# Patient Record
Sex: Male | Born: 1965 | Race: White | Hispanic: No | Marital: Single | Smoking: Current every day smoker
Health system: Southern US, Community
[De-identification: ages and names within clinical notes are randomized; demographics above are authoritative.]

## PROBLEM LIST (undated history)

## (undated) ENCOUNTER — Emergency Department (HOSPITAL_COMMUNITY): Admission: EM | Payer: Self-pay | Source: Home / Self Care

## (undated) DIAGNOSIS — F1010 Alcohol abuse, uncomplicated: Secondary | ICD-10-CM

## (undated) DIAGNOSIS — F419 Anxiety disorder, unspecified: Secondary | ICD-10-CM

---

## 2005-08-30 ENCOUNTER — Emergency Department (HOSPITAL_COMMUNITY): Admission: EM | Admit: 2005-08-30 | Discharge: 2005-08-31 | Payer: Self-pay | Admitting: Emergency Medicine

## 2005-09-21 ENCOUNTER — Emergency Department (HOSPITAL_COMMUNITY): Admission: EM | Admit: 2005-09-21 | Discharge: 2005-09-21 | Payer: Self-pay | Admitting: Emergency Medicine

## 2005-10-04 ENCOUNTER — Emergency Department (HOSPITAL_COMMUNITY): Admission: EM | Admit: 2005-10-04 | Discharge: 2005-10-04 | Payer: Self-pay | Admitting: Emergency Medicine

## 2006-01-09 ENCOUNTER — Emergency Department (HOSPITAL_COMMUNITY): Admission: EM | Admit: 2006-01-09 | Discharge: 2006-01-09 | Payer: Self-pay | Admitting: Emergency Medicine

## 2010-04-30 ENCOUNTER — Emergency Department (HOSPITAL_COMMUNITY): Admission: EM | Admit: 2010-04-30 | Discharge: 2010-05-01 | Payer: Self-pay | Admitting: Emergency Medicine

## 2010-12-08 ENCOUNTER — Emergency Department (HOSPITAL_COMMUNITY)
Admission: EM | Admit: 2010-12-08 | Discharge: 2010-12-09 | Disposition: A | Discharge status: Home / Self Care | Payer: Self-pay | Attending: Emergency Medicine | Admitting: Emergency Medicine

## 2010-12-08 DIAGNOSIS — F329 Major depressive disorder, single episode, unspecified: Secondary | ICD-10-CM | POA: Insufficient documentation

## 2010-12-08 DIAGNOSIS — F1020 Alcohol dependence, uncomplicated: Secondary | ICD-10-CM | POA: Insufficient documentation

## 2010-12-08 DIAGNOSIS — F3289 Other specified depressive episodes: Secondary | ICD-10-CM | POA: Insufficient documentation

## 2010-12-08 DIAGNOSIS — F17200 Nicotine dependence, unspecified, uncomplicated: Secondary | ICD-10-CM | POA: Insufficient documentation

## 2010-12-08 DIAGNOSIS — E789 Disorder of lipoprotein metabolism, unspecified: Secondary | ICD-10-CM | POA: Insufficient documentation

## 2010-12-08 LAB — COMPREHENSIVE METABOLIC PANEL
ALT: 68 U/L — ABNORMAL HIGH (ref 0–53)
Albumin: 4 g/dL (ref 3.5–5.2)
Alkaline Phosphatase: 72 U/L (ref 39–117)
BUN: 7 mg/dL (ref 6–23)
Chloride: 105 mEq/L (ref 96–112)
Potassium: 3.2 mEq/L — ABNORMAL LOW (ref 3.5–5.1)
Sodium: 143 mEq/L (ref 135–145)
Total Bilirubin: 0.5 mg/dL (ref 0.3–1.2)
Total Protein: 7.5 g/dL (ref 6.0–8.3)

## 2010-12-08 LAB — URINALYSIS, ROUTINE W REFLEX MICROSCOPIC
Ketones, ur: 15 mg/dL — AB
Nitrite: NEGATIVE
Protein, ur: NEGATIVE mg/dL

## 2010-12-08 LAB — CBC
MCHC: 36.5 g/dL — ABNORMAL HIGH (ref 30.0–36.0)
MCV: 85.7 fL (ref 78.0–100.0)
Platelets: 246 10*3/uL (ref 150–400)
RDW: 13 % (ref 11.5–15.5)
WBC: 6 10*3/uL (ref 4.0–10.5)

## 2010-12-08 LAB — DIFFERENTIAL
Basophils Absolute: 0 10*3/uL (ref 0.0–0.1)
Eosinophils Absolute: 0.3 10*3/uL (ref 0.0–0.7)
Eosinophils Relative: 5 % (ref 0–5)
Monocytes Absolute: 0.3 10*3/uL (ref 0.1–1.0)

## 2010-12-08 LAB — SALICYLATE LEVEL: Salicylate Lvl: <4 mg/dL (ref 2.8–20.0)

## 2010-12-08 LAB — ACETAMINOPHEN LEVEL: Acetaminophen (Tylenol), Serum: <10 ug/mL — ABNORMAL LOW (ref 10–30)

## 2010-12-08 LAB — RAPID URINE DRUG SCREEN, HOSP PERFORMED
Cocaine: NOT DETECTED
Opiates: NOT DETECTED

## 2011-11-22 ENCOUNTER — Encounter (HOSPITAL_COMMUNITY): Payer: Self-pay | Admitting: *Deleted

## 2011-11-22 ENCOUNTER — Encounter (HOSPITAL_COMMUNITY): Payer: Self-pay

## 2011-11-22 ENCOUNTER — Emergency Department (HOSPITAL_COMMUNITY)
Admission: EM | Admit: 2011-11-22 | Discharge: 2011-11-22 | Discharge status: Against Medical Advice | Payer: Self-pay | Attending: Emergency Medicine | Admitting: Emergency Medicine

## 2011-11-22 ENCOUNTER — Emergency Department (HOSPITAL_COMMUNITY)
Admission: EM | Admit: 2011-11-22 | Discharge: 2011-11-22 | Disposition: A | Discharge status: Home / Self Care | Payer: Self-pay | Attending: Emergency Medicine | Admitting: Emergency Medicine

## 2011-11-22 DIAGNOSIS — F411 Generalized anxiety disorder: Secondary | ICD-10-CM | POA: Insufficient documentation

## 2011-11-22 DIAGNOSIS — R1110 Vomiting, unspecified: Secondary | ICD-10-CM | POA: Insufficient documentation

## 2011-11-22 DIAGNOSIS — R197 Diarrhea, unspecified: Secondary | ICD-10-CM | POA: Insufficient documentation

## 2011-11-22 DIAGNOSIS — I10 Essential (primary) hypertension: Secondary | ICD-10-CM | POA: Insufficient documentation

## 2011-11-22 DIAGNOSIS — F1010 Alcohol abuse, uncomplicated: Secondary | ICD-10-CM | POA: Insufficient documentation

## 2011-11-22 DIAGNOSIS — F419 Anxiety disorder, unspecified: Secondary | ICD-10-CM

## 2011-11-22 HISTORY — DX: Alcohol abuse, uncomplicated: F10.10

## 2011-11-22 HISTORY — DX: Anxiety disorder, unspecified: F41.9

## 2011-11-22 MED ORDER — CITALOPRAM HYDROBROMIDE 10 MG PO TABS: 10.0000 mg | ORAL_TABLET | Freq: Every day | ORAL | Status: DC

## 2011-11-22 NOTE — ED Notes (Signed)
Pt returned via EMS. Pt admits to taking Valium 5 mg x2 tabs and drinking two beers. Pt is intoxicated with ETOH.

## 2011-11-22 NOTE — ED Notes (Signed)
Pt left,  [employer]  Watched pt walk to [address]

## 2011-11-22 NOTE — ED Notes (Signed)
Pt c/o vomiting and diarrhea x 2 days, and hypertension. Pt states he drank 22 oz of beer tonight and then vomited.

## 2011-11-22 NOTE — ED Provider Notes (Addendum)
History     CSN: [id number]  Arrival date & time 11/22/11  0506   First MD Initiated Contact with Patient 11/22/11 [phone]      Chief Complaint  Patient presents with  . Anxiety  . Hypertension     HPI Pt states he had been on xanax for 19 years.  He moved to [place] about 6 months ago.  Pt stopped taking it last summer.  Pt states he had been seeing a doctor but they stopped prescribing the medications.  Pt states he will get anxious and his blood pressure will elevate.  He came into the emergency room because it was bothering him again last night.  He took a couple of his sister's valium and took two natural light beers.  Pt is feeling better now.    Past Medical History  Diagnosis Date  . Anxiety   . ETOH abuse     History reviewed. No pertinent past surgical history.  History reviewed. No pertinent family history.  History  Substance Use Topics  . Smoking status: Current Everyday Smoker  . Smokeless tobacco: Not on file  . Alcohol Use: Yes      Review of Systems  All other systems reviewed and are negative.    Allergies  Penicillins  Home Medications  No current outpatient prescriptions on file.  BP 117/83  Pulse 92  Temp(Src) 98.5 F (36.9 C) (Oral)  Resp 20  SpO2 96%  Physical Exam  Nursing note and vitals reviewed. Constitutional: He appears well-developed and well-nourished. No distress.  HENT:  Head: Normocephalic and atraumatic.  Right Ear: External ear normal.  Left Ear: External ear normal.       etoh odor on breath  Eyes: Conjunctivae are normal. Right eye exhibits no discharge. Left eye exhibits no discharge. No scleral icterus.  Neck: Neck supple. No tracheal deviation present.  Cardiovascular: Normal rate, regular rhythm and intact distal pulses.   Pulmonary/Chest: Effort normal and breath sounds normal. No stridor. No respiratory distress. He has no wheezes. He has no rales.  Abdominal: Soft. Bowel sounds are normal. He exhibits no  distension. There is no tenderness. There is no rebound and no guarding.  Musculoskeletal: He exhibits no edema and no tenderness.  Neurological: He is alert. He has normal strength. No sensory deficit. Cranial nerve deficit:  no gross defecits noted. He exhibits normal muscle tone. He displays no seizure activity. Coordination normal.  Skin: Skin is warm and dry. No rash noted.  Psychiatric: He has a normal mood and affect.    ED Course  Procedures (including critical care time)  Labs Reviewed - No data to display No results found.   1. Anxiety       MDM  Pt without SI or HI.  Bp is normal here.  NO need to start BP medications although pt resquests them.  DO not recommend continuing valium or other benzodiazepines.  Pt does not want to take celexa or lexapro. No emergency medical condition.  Safe for outpatient follow up     Celene Kras, MD 11/22/11 [phone]

## 2011-11-22 NOTE — Discharge Instructions (Signed)

## 2011-11-22 NOTE — ED Notes (Signed)
When getting pt ready for discharge, pt became upset and was telling staff that he was going to sue the hospital and the doctor for not treating him for his "high blood pressure" (pt's BP was 117/83).  This is the third time pt has been here (has previously left [facility]).  Pt has already been informed that if he leaves again, he is not welcome back and will be escorted off the premises.  Pt left w/o being discharged.  Security/off duty informed.

## 2011-11-22 NOTE — ED Notes (Signed)
Per [facility]- pt report N/V/D x 2 days- last vomiting 20 minutes ago-

## 2011-11-22 NOTE — ED Notes (Signed)
Pt ask to obtain urine sample,  Brought urine sample back that appeared to be water,   Pt questioned and he stated he urinated 2 to3 times then it became clear.

## 2011-12-17 ENCOUNTER — Emergency Department (HOSPITAL_COMMUNITY)
Admission: EM | Admit: 2011-12-17 | Discharge: 2011-12-19 | Disposition: A | Discharge status: Other Acute Inpatient Hospital | Payer: Self-pay | Attending: Emergency Medicine | Admitting: Emergency Medicine

## 2011-12-17 ENCOUNTER — Encounter (HOSPITAL_COMMUNITY): Payer: Self-pay | Admitting: Emergency Medicine

## 2011-12-17 DIAGNOSIS — F341 Dysthymic disorder: Secondary | ICD-10-CM | POA: Insufficient documentation

## 2011-12-17 DIAGNOSIS — R45851 Suicidal ideations: Secondary | ICD-10-CM | POA: Insufficient documentation

## 2011-12-17 LAB — RAPID URINE DRUG SCREEN, HOSP PERFORMED
Barbiturates: NOT DETECTED
Benzodiazepines: NOT DETECTED

## 2011-12-17 LAB — DIFFERENTIAL
Basophils Absolute: 0.1 10*3/uL (ref 0.0–0.1)
Basophils Relative: 1 % (ref 0–1)
Eosinophils Absolute: 0.2 10*3/uL (ref 0.0–0.7)
Eosinophils Relative: 4 % (ref 0–5)
Lymphocytes Relative: 38 % (ref 12–46)
Lymphs Abs: 2.6 10*3/uL (ref 0.7–4.0)
Monocytes Absolute: 0.4 10*3/uL (ref 0.1–1.0)
Monocytes Relative: 6 % (ref 3–12)
Neutro Abs: 3.5 10*3/uL (ref 1.7–7.7)
Neutrophils Relative %: 51 % (ref 43–77)

## 2011-12-17 LAB — COMPREHENSIVE METABOLIC PANEL
ALT: 138 U/L — ABNORMAL HIGH (ref 0–53)
AST: 66 U/L — ABNORMAL HIGH (ref 0–37)
Albumin: 4.4 g/dL (ref 3.5–5.2)
Alkaline Phosphatase: 64 U/L (ref 39–117)
BUN: 17 mg/dL (ref 6–23)
CO2: 30 mEq/L (ref 19–32)
Calcium: 9.5 mg/dL (ref 8.4–10.5)
Chloride: 104 mEq/L (ref 96–112)
Creatinine, Ser: 1 mg/dL (ref 0.50–1.35)
GFR calc Af Amer: >90 mL/min (ref >90)
GFR calc non Af Amer: 89 mL/min — ABNORMAL LOW (ref >90)
Glucose, Bld: 89 mg/dL (ref 70–99)
Potassium: 4.3 mEq/L (ref 3.5–5.1)
Sodium: 147 mEq/L — ABNORMAL HIGH (ref 135–145)
Total Bilirubin: 1.1 mg/dL (ref 0.3–1.2)
Total Protein: 8.3 g/dL (ref 6.0–8.3)

## 2011-12-17 LAB — CBC
HCT: 49.4 % (ref 39.0–52.0)
Hemoglobin: 18.2 g/dL — ABNORMAL HIGH (ref 13.0–17.0)
MCH: 32.6 pg (ref 26.0–34.0)
MCHC: 36.8 g/dL — ABNORMAL HIGH (ref 30.0–36.0)
MCV: 88.5 fL (ref 78.0–100.0)
Platelets: 261 10*3/uL (ref 150–400)
RBC: 5.58 MIL/uL (ref 4.22–5.81)
RDW: 12.2 % (ref 11.5–15.5)
WBC: 6.8 10*3/uL (ref 4.0–10.5)

## 2011-12-17 MED ORDER — ONDANSETRON HCL 8 MG PO TABS: 4.0000 mg | ORAL_TABLET | Freq: Three times a day (TID) | ORAL | Status: DC | PRN

## 2011-12-17 MED ORDER — LORAZEPAM 1 MG PO TABS: 1.0000 mg | ORAL_TABLET | Freq: Three times a day (TID) | ORAL | Status: DC | PRN

## 2011-12-17 MED ORDER — NICOTINE 14 MG/24HR TD PT24: 14.0000 mg | MEDICATED_PATCH | Freq: Once | TRANSDERMAL | Status: DC

## 2011-12-17 MED ORDER — NICOTINE 21 MG/24HR TD PT24: 21.0000 mg | MEDICATED_PATCH | Freq: Every day | TRANSDERMAL | Status: DC

## 2011-12-17 MED ORDER — ALUM & MAG HYDROXIDE-SIMETH 200-200-20 MG/5ML PO SUSP: 30.0000 mL | ORAL | Status: DC | PRN

## 2011-12-17 MED ORDER — ZOLPIDEM TARTRATE 5 MG PO TABS: 5.0000 mg | ORAL_TABLET | Freq: Every evening | ORAL | Status: DC | PRN

## 2011-12-17 NOTE — ED Notes (Signed)
Security called for [employer] called for and house coverage and charge notified per bev unit security

## 2011-12-17 NOTE — ED Notes (Signed)
Dinner tray delivered.

## 2011-12-17 NOTE — ED Notes (Signed)
Got out of jail yesterday and started drinking again needs meds clearancehaving SI

## 2011-12-17 NOTE — ED Notes (Signed)
Clothes wallet  Shoes given to  [facility]  sister
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
## 2011-12-17 NOTE — ED Notes (Signed)
Pt changed to scubs

## 2011-12-17 NOTE — ED Notes (Signed)
Dinner tray ordered, reg nonsharp 

## 2011-12-18 NOTE — ED Provider Notes (Signed)
Pt had tele-psych and was initially cleared for discharge--act spoke with pts sister who expressed concern that pt may harm himself, pt is homeless and no support system,, he states that he is unsure if he will hurt himself if he leaves here--he isn't cleared for d/c and will need placement  Toy Baker, MD 12/18/11 2318

## 2011-12-18 NOTE — ED Notes (Signed)
Pt rounding completed. Pt states he's been drinking approx 12 pack of beer/day for the last year. Last period of sobriety was approx 1 year ago for 4 months. Also reports intermittent crack cocaine use approx every few months.  Pt reports recent job loss. States pretty severe anxiety and depression. Denies shakes at present. Denies SI/HI.  States slept very little overnight, just dozing. Pt ate all of breakfast. Cooperative with care. No new concerns.

## 2011-12-18 NOTE — ED Notes (Signed)
Pt doing telepsych at this time.  
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
## 2011-12-18 NOTE — ED Notes (Signed)
MD and Behavioral health at bedside for pt evaluation for disposition.

## 2011-12-18 NOTE — ED Provider Notes (Signed)
Patient awaiting possible admission at [facility].  He is not suicidal or homicidal and does not require sitter so the sitter will be canceled at this time.  Patient is otherwise medically stable.  Nat Christen, MD 12/18/11 [phone]

## 2011-12-19 ENCOUNTER — Inpatient Hospital Stay (HOSPITAL_COMMUNITY)
Admission: AD | Admit: 2011-12-19 | Discharge: 2011-12-22 | DRG: 897 | Disposition: A | Discharge status: Home / Self Care | Payer: No Typology Code available for payment source | Source: Other Acute Inpatient Hospital | Attending: Psychiatry | Admitting: Psychiatry

## 2011-12-19 DIAGNOSIS — F1010 Alcohol abuse, uncomplicated: Secondary | ICD-10-CM | POA: Diagnosis present

## 2011-12-19 DIAGNOSIS — F10239 Alcohol dependence with withdrawal, unspecified: Principal | ICD-10-CM | POA: Diagnosis present

## 2011-12-19 DIAGNOSIS — F39 Unspecified mood [affective] disorder: Secondary | ICD-10-CM | POA: Diagnosis present

## 2011-12-19 DIAGNOSIS — F10939 Alcohol use, unspecified with withdrawal, unspecified: Principal | ICD-10-CM | POA: Diagnosis present

## 2011-12-19 DIAGNOSIS — R3916 Straining to void: Secondary | ICD-10-CM | POA: Diagnosis present

## 2011-12-19 DIAGNOSIS — F1020 Alcohol dependence, uncomplicated: Secondary | ICD-10-CM | POA: Diagnosis present

## 2011-12-19 DIAGNOSIS — F329 Major depressive disorder, single episode, unspecified: Secondary | ICD-10-CM

## 2011-12-19 MED ORDER — ONDANSETRON 4 MG PO TBDP: 4.0000 mg | ORAL_TABLET | Freq: Four times a day (QID) | ORAL | Status: AC | PRN

## 2011-12-19 MED ORDER — HYDROXYZINE HCL 25 MG PO TABS: 25.0000 mg | ORAL_TABLET | Freq: Four times a day (QID) | ORAL | Status: DC | PRN

## 2011-12-19 MED ORDER — CHLORDIAZEPOXIDE HCL 25 MG PO CAPS
25.0000 mg | ORAL_CAPSULE | Freq: Four times a day (QID) | ORAL | Status: AC | PRN
  Administered 2011-12-19 – 2011-12-21 (×5): 25 mg via ORAL
  Filled 2011-12-19 (×5): qty 1

## 2011-12-19 MED ORDER — ACETAMINOPHEN 325 MG PO TABS: 650.0000 mg | ORAL_TABLET | Freq: Four times a day (QID) | ORAL | Status: DC | PRN

## 2011-12-19 MED ORDER — TAMSULOSIN HCL 0.4 MG PO CAPS
0.8000 mg | ORAL_CAPSULE | Freq: Every day | ORAL | Status: DC
  Administered 2011-12-19 – 2011-12-22 (×4): 0.8 mg via ORAL
  Filled 2011-12-19 (×6): qty 2

## 2011-12-19 MED ORDER — MAGNESIUM HYDROXIDE 400 MG/5ML PO SUSP: 30.0000 mL | Freq: Every day | ORAL | Status: DC | PRN

## 2011-12-19 MED ORDER — IBUPROFEN 600 MG PO TABS
600.0000 mg | ORAL_TABLET | Freq: Four times a day (QID) | ORAL | Status: DC | PRN
  Administered 2011-12-19: 600 mg via ORAL
  Filled 2011-12-19 (×3): qty 1

## 2011-12-19 MED ORDER — CITALOPRAM HYDROBROMIDE 20 MG PO TABS
20.0000 mg | ORAL_TABLET | Freq: Every day | ORAL | Status: DC
  Administered 2011-12-19 – 2011-12-22 (×4): 20 mg via ORAL
  Filled 2011-12-19: qty 1
  Filled 2011-12-19: qty 14
  Filled 2011-12-19 (×5): qty 1

## 2011-12-19 MED ORDER — LIDOCAINE 5 % EX PTCH
1.0000 | MEDICATED_PATCH | CUTANEOUS | Status: DC
Start: 2011-12-19 — End: 2011-12-22
  Filled 2011-12-19 (×6): qty 1

## 2011-12-19 MED ORDER — THIAMINE HCL 100 MG/ML IJ SOLN: 100.0000 mg | Freq: Once | INTRAMUSCULAR | Status: DC

## 2011-12-19 MED ORDER — TRAZODONE HCL 50 MG PO TABS
50.0000 mg | ORAL_TABLET | Freq: Every evening | ORAL | Status: DC | PRN
  Administered 2011-12-19 – 2011-12-21 (×3): 50 mg via ORAL
  Filled 2011-12-19 (×3): qty 1

## 2011-12-19 MED ORDER — ADULT MULTIVITAMIN W/MINERALS CH
1.0000 | ORAL_TABLET | Freq: Every day | ORAL | Status: DC
  Administered 2011-12-19 – 2011-12-22 (×4): 1 via ORAL
  Filled 2011-12-19 (×4): qty 1
  Filled 2011-12-19: qty 14
  Filled 2011-12-19: qty 1

## 2011-12-19 MED ORDER — ALUM & MAG HYDROXIDE-SIMETH 200-200-20 MG/5ML PO SUSP: 30.0000 mL | ORAL | Status: DC | PRN

## 2011-12-19 MED ORDER — VITAMIN B-1 100 MG PO TABS
100.0000 mg | ORAL_TABLET | Freq: Every day | ORAL | Status: DC
  Administered 2011-12-20 – 2011-12-22 (×3): 100 mg via ORAL
  Filled 2011-12-19 (×4): qty 1
  Filled 2011-12-19: qty 14

## 2011-12-19 MED ORDER — LOPERAMIDE HCL 2 MG PO CAPS: 2.0000 mg | ORAL_CAPSULE | ORAL | Status: AC | PRN

## 2011-12-19 MED ORDER — NICOTINE 21 MG/24HR TD PT24
21.0000 mg | MEDICATED_PATCH | Freq: Every day | TRANSDERMAL | Status: DC
  Administered 2011-12-19 – 2011-12-22 (×4): 21 mg via TRANSDERMAL
  Filled 2011-12-19 (×4): qty 1
  Filled 2011-12-19: qty 14
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
## 2011-12-19 NOTE — Progress Notes (Signed)
Patient ID: Nathaniel Lewis, male   DOB: Oct 29, 1965, 46 y.o.   MRN: [id number] Pt. attended and participated in aftercare planning group. Pt. accepted information on suicide prevention, warning signs to look for with suicide and crisis line numbers to use. The pt. agreed to call crisis line numbers if having warning signs or having thoughts of suicide. Pt. listed their current anxiety level as  6 and depression as 6 on scale of 1 to 10 with 10 being the high.

## 2011-12-19 NOTE — Progress Notes (Signed)
Pt in bathroom shaving on approach, denies complaints, denies withdrawal at the moment.  Pt states he had some nausea with no vomiting earlier today but states that it passed quickly.  Pt denies SI/HI/hallucinations.  Support and encouragement offered, will continue to monitor.
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
## 2011-12-19 NOTE — H&P (Signed)
I have read the H&P, interviewed the patient, and I agree with the findings above.  Mahad Newstrom, MD   

## 2011-12-19 NOTE — Tx Team (Signed)
Initial Interdisciplinary Treatment Plan  PATIENT STRENGTHS: (choose at least two) Ability for insight Average or above average intelligence Capable of independent living Motivation for treatment/growth Supportive family/friends  PATIENT STRESSORS: Financial difficulties Substance abuse   PROBLEM LIST: Problem List/Patient Goals Date to be addressed Date deferred Reason deferred Estimated date of resolution  Substance abuse: ETOH 12/19/11     Depression 12/19/11                                                DISCHARGE CRITERIA:  Adequate post-discharge living arrangements Motivation to continue treatment in a less acute level of care Need for constant or close observation no longer present Withdrawal symptoms are absent or subacute and managed without 24-hour nursing intervention  PRELIMINARY DISCHARGE PLAN: Attend 12-step recovery group Placement in alternative living arrangements  PATIENT/FAMIILY INVOLVEMENT: This treatment plan has been presented to and reviewed with the patient, Nathaniel Lewis.  The patient and family have been given the opportunity to ask questions and make suggestions.  Juliann Pares 12/19/2011, 5:39 AM
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
## 2011-12-19 NOTE — ED Notes (Signed)
Behavioral Health at bedside for pt plan of care updated. Pt will be transported to [facility].

## 2011-12-19 NOTE — BH Assessment (Signed)
Assessment Note   Nathaniel Lewis was accepted by Dr. Lolly Mustache to Dr. Koren Shiver room to 305.1.  [name] 12/19/2011 1:31 AM

## 2011-12-19 NOTE — Progress Notes (Signed)
Pt has been up and has been visible and has been attending and participating in various milieu activities. Pt does report depression and feelings of hopelessness as well as agitation, pt spoke of alcohol use and that he was self-medicating himself, pt has had minimal signs and symptoms of withdrawal today and has received medication without incident, will continue to monitor

## 2011-12-19 NOTE — Progress Notes (Signed)
Patient ID: Darshawn Boateng, male   DOB: 1966/03/22, 46 y.o.   MRN: [id number]  [facility] Group Notes:  (Counselor/Nursing/MHT/Case Management/Adjunct)  12/19/2011 1:15 PM  Type of Therapy:  Group Therapy, Dance/Movement Therapy   Participation Level:  Minimal  Participation Quality:  Appropriate  Affect:  Appropriate  Cognitive:  Appropriate  Insight:  Limited  Engagement in Group:  Limited  Engagement in Therapy:  Limited  Modes of Intervention:  Clarification, Problem-solving, Role-play, Socialization and Support  Summary of Progress/Problems:  Therapist invited group to draw themselves climbing a mountain to include obstacles, challenges and the supplies that will enable them to move up the mountain.  Therapist asked group to discuss the challenges in order to assess where they are presently and where they want to be.  Pt. stated " Pt. stated that my obstacles are negative influences and bad people.  At the top of my mountains is my medication, counseling and AA meetings.  Pt. seems to be aware of obstacle in his life and the necessary tools it will take to overcome barriers.     Rhunette Croft

## 2011-12-20 DIAGNOSIS — F329 Major depressive disorder, single episode, unspecified: Secondary | ICD-10-CM

## 2011-12-20 DIAGNOSIS — F1010 Alcohol abuse, uncomplicated: Secondary | ICD-10-CM

## 2011-12-20 MED ORDER — NAPHAZOLINE HCL 0.1 % OP SOLN
1.0000 [drp] | Freq: Four times a day (QID) | OPHTHALMIC | Status: DC | PRN
  Filled 2011-12-20: qty 15

## 2011-12-20 NOTE — Progress Notes (Signed)
[facility] Adult Inpatient Family/Significant Other Suicide Prevention Education  Suicide Prevention Education:  Education Completed; Joya Gaskins, Sister, has been identified by the patient as the family member/significant otherwho will aid the patient in the event of a mental health crisis (suicidal ideations/suicide attempt).  With written consent from the patient, the family member/significant other has been provided the following suicide prevention education, prior to the and/or following the discharge of the patient.  The suicide prevention education provided includes the following:  Suicide risk factors  Suicide prevention and interventions  National Suicide Hotline telephone number  [facility] assessment telephone number  [facility] Emergency Assistance 911  [facility] and/or Residential Mobile Crisis Unit telephone number  Request made of family/significant other to:  Remove weapons (e.g., guns, rifles, knives), all items previously/currently identified as safety concern.    Remove drugs/medications (over-the-counter, prescriptions, illicit drugs), all items previously/currently identified as a safety concern.  The family member/significant other verbalizes understanding of the suicide prevention education information provided.  The family member/significant other agrees to remove the items of safety concern listed above.  Marni Griffon C 12/20/2011, 1:13 PM

## 2011-12-20 NOTE — Progress Notes (Signed)
Slept well last nite, appetite is good, going to Dr for meals, energy level is normal, ability to pay attention is improving, depressed 6/10 and hopeless 5/10, denies Si or Hi, taking meds as ordered by MD, going to group, is very quiet. q63min safety checks continue and support offered Safety maintained

## 2011-12-20 NOTE — Progress Notes (Signed)
Patient ID: Nathaniel Lewis, male   DOB: 1965/12/26, 46 y.o.   MRN: [id number] Nathaniel Lewis reports that he is feeling better today, he is having less difficulty urinating and feels that the Flomax is helping him. He has had problems with difficulty starting his stream of urine for the past 2 years, getting very slowly worse.  He has never been treated by a urologist or examined.  He continues to have feelings of depression on and off for the past 24 hours and rates his depression is 6/10 on a 1-10 scale if 10 is the worst symptoms. He rates his anxiety is 6/10, hopelessness 5 out of 10. He reports his sleep is good. Appetite good. Says he has eaten too much for lunch. Eyes are red and irritated without exudate.  Mental status exam: Fully alert male, appropriately dressed, blunt affect, cooperative. He is polite. Thoughts and speech is normally organized. Affect is blunt, mood is mildly depressed. He has been attending all group therapy sessions and is keeping notes for himself. He is anxious to meet his counselor so he can make plans going forward. No active suicidal thoughts, no suicidal intent, no suicidal plan. Insight and fair to poor, impulse control and judgment normal. No evidence of acute withdrawal symptoms. He is fully oriented with no signs of delirium or confusion.  Plan: Visine eyedrops. Continue the Flomax. Continue Celexa.
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
## 2011-12-20 NOTE — Progress Notes (Signed)
Patient ID: Nathaniel Lewis, male   DOB: 1966/02/09, 46 y.o.   MRN: [id number]  [facility] Group Notes:  (Counselor/Nursing/MHT/Case Management/Adjunct)  12/20/2011 1:15 PM  Type of Therapy:  Group Therapy, Dance/Movement Therapy   Participation Level:  Minimal  Participation Quality:  Appropriate  Affect:  Appropriate  Cognitive:  Appropriate  Insight:  Limited  Engagement in Group:  Limited  Engagement in Therapy:  Good  Modes of Intervention:  Clarification, Problem-solving, Role-play, Socialization and Support  Summary of Progress/Problems: herapist discussed how cognitive distortions can led to past behaviors of addiction. Group discussed the importance and types of supports needed to assist them in order to prevent relapse.  Pt. stated' my wife and family are support".   Pt. seems to be gaining awareness of the support systems needed in his recovery.      Rhunette Croft

## 2011-12-20 NOTE — Progress Notes (Signed)
Pt pleasant on approach, sitting in dayroom watching TV.  Positive for AA group this evening, no complaints voiced.  Pt denies SI/HI/hallucinations.  Support and encouragement offered, will continue to monitor.

## 2011-12-20 NOTE — Progress Notes (Signed)
[facility] Family/Significant Other Suicide Prevention Education  Suicide Prevention Education:  Contact Attempts: Claris Gower [phone]- has been identified by the patient as the family member/significant other with whom the patient will be residing, and identified as the person(s) who will aid the patient in the event of a mental health crisis.  With written consent from the patient, two attempts were made to provide suicide prevention education, prior to and/or following the patient's discharge.  We were unsuccessful in providing suicide prevention education.  A suicide education pamphlet was given to the patient to share with family/significant other.  Date and time of first attempt:  By [name] on 12/20/11 at 2:23 p.m. Date and time of second attempt:  Neila Gear 12/20/2011, 2:22 PM

## 2011-12-21 DIAGNOSIS — F39 Unspecified mood [affective] disorder: Secondary | ICD-10-CM

## 2011-12-21 MED ORDER — TETRAHYDROZOLINE HCL 0.05 % OP SOLN
1.0000 [drp] | Freq: Four times a day (QID) | OPHTHALMIC | Status: DC | PRN
  Administered 2011-12-21 – 2011-12-22 (×2): 1 [drp] via OPHTHALMIC
  Filled 2011-12-21: qty 15

## 2011-12-21 NOTE — Discharge Planning (Signed)
New patient attended AM group, good participation.  C/O anxiety, and I noticed his leg shaking earlier in the group.  States he's found klonopin to be helpful, but lost the provider who was willing to prescribe it for him.  Admits to alcohol abuse.  Went to [facility] several months ago.  Unemployed.  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   Plans to live in an [facility] he has talked to contact who will pave the way.  He just needs to know when he will be d/ced.  Assured him Dr would consult with him on that today.  Says he has some money to get into a half way house.

## 2011-12-21 NOTE — Treatment Plan (Signed)
Interdisciplinary Treatment Plan Update (Adult)  Date: 12/21/2011  Time Reviewed: 3:06 PM   Progress in Treatment: Attending groups: Yes Participating in groups: Yes Taking medication as prescribed: Yes Tolerating medication: Yes   Family/Significant other contact made:  No Patient understands diagnosis:  Yes  As evidenced by asking for help with anxiety, alcohol abuse Discussing patient identified problems/goals with staff:  Yes See below Medical problems stabilized or resolved:  Yes Denies suicidal/homicidal ideation: Yes  In tx team  Issues/concerns per patient self-inventory:  Yes  C/O anxiety,  Depression a 4 Other:  New problem(s) identified: N/A  Reason for Continuation of Hospitalization:  Medication stabilization [celexa]  Encourage group attendance and participation  Encourage pursuit of [employer] Anxiety Medication stabilization  Interventions implemented related to continuation of hospitalization:   Additional comments:  Estimated length of stay: 1-2 days  Discharge Plan: [facility],  Mental health New goal(s): N/A  Review of initial/current patient goals per problem list:   1.  Goal(s):Decrease depression from current level to 3 or less on self inventory  Met:  No  Target date:5/7  As evidenced ZO:XWRU report  2.  Goal (s):Get into an [facility] from here  Met:  No  Target date:5/7  As evidenced EA:[id number] report of interview or acceptance  3.  Goal(s):  Met:  No  Target date:  As evidenced by:  4.  Goal(s):  Met:  No  Target date:  As evidenced by:  Attendees: Patient:  Nathaniel Lewis 12/21/2011 3:06 PM  Family:     Physician:  Lupe Carney 12/21/2011 3:06 PM   Nursing:  Robbie Louis 12/21/2011 3:06 PM   Case Manager:  Richelle Ito, LCSW 12/21/2011 3:06 PM   Counselor:  Ronda Fairly, LCSWA 12/21/2011 3:06 PM   Other:     Other:     Other:     Other:      Scribe for Treatment Team:   Ida Rogue, 12/21/2011 3:06 PM

## 2011-12-21 NOTE — Progress Notes (Signed)
[facility] MD Progress Note  12/21/2011 5:43 PM  S/O: Patient seen and evaluated. Chart reviewed. Patient stated that his mood was "good this morning, but worse now". His affect was mood congruent and irritable. He denied any current thoughts of self injurious behavior, suicidal ideation or homicidal ideation. There were no auditory or visual hallucinations, paranoia, delusional thought processes, or mania noted.  Thought process was linear and goal directed.  No psychomotor agitation or retardation was noted. His speech was normal rate, decreased tone and volume. Eye contact was fair. Judgment and insight are limited.  Patient has been up and engaged on the unit.  No acute safety concerns reported from team.  No acute w/d s/s noted.    Sleep:  Number of Hours: 5.25    Vital Signs:Blood pressure 92/60, pulse 89, temperature 97.7 F (36.5 C), temperature source Oral, resp. rate 18, height 5\' 6"  (1.676 m), weight 66.679 kg (147 lb).  Current Medications:    . citalopram  20 mg Oral Daily  . lidocaine  1 patch Transdermal Q24H  . mulitivitamin with minerals  1 tablet Oral Daily  . nicotine  21 mg Transdermal Q0600  . Tamsulosin HCl  0.8 mg Oral QPC supper  . thiamine  100 mg Intramuscular Once  . thiamine  100 mg Oral Daily    Lab Results:  Results for orders placed during the hospital encounter of 12/19/11 (from the past 48 hour(s))  T4, FREE     Status: Normal   Collection Time   12/21/11  6:25 AM      Component Value Range Comment   Free T4 1.07  0.80 - 1.80 (ng/dL)   T3, FREE     Status: Normal   Collection Time   12/21/11  6:25 AM      Component Value Range Comment   T3, Free 2.8  2.3 - 4.2 (pg/mL)   TSH     Status: Normal   Collection Time   12/21/11  6:25 AM      Component Value Range Comment   TSH 1.154  0.350 - 4.500 (uIU/mL)     Physical Findings: CIWA:  CIWA-Ar Total: 3   A/P: Alcohol Dependence; Mood Disorder NOS  Continue current meds, pt just started on citalopram.  Pt  interested in transitioning to an [employer]. Potential discharge in am if dispo clarified and pt remains stable.  Lupe Carney 12/21/2011, 5:43 PM

## 2011-12-21 NOTE — Progress Notes (Signed)
[facility] Group Notes:  (Counselor/Nursing/MHT/Case Management/Adjunct)  12/21/2011 4:04 PM  Type of Therapy:  Group Therapy at 11 and 1:15  Participation Level:  Did Not Attend   Clide Dales 12/21/2011, 4:04 PM

## 2011-12-21 NOTE — Progress Notes (Signed)
Slept well last nite, appetite is good, energy level is normal and ability to pay attention is improving, depressed 4/10 and hopeless 3/10, denies Si or HI, is complaining of anxiety and depression in treatment meeting this a.m. Is hoping to get into an [facility], eating meals in the DR, taking meds as ordered, attending group. q9mins safety checks continue and support offered Safety maintained

## 2011-12-22 MED ORDER — CITALOPRAM HYDROBROMIDE 20 MG PO TABS: 20.0000 mg | ORAL_TABLET | Freq: Every day | ORAL | Status: AC

## 2011-12-22 MED ORDER — TETRAHYDROZOLINE HCL 0.05 % OP SOLN: 1.0000 [drp] | Freq: Four times a day (QID) | OPHTHALMIC | Status: AC | PRN

## 2011-12-22 MED ORDER — IBUPROFEN 600 MG PO TABS: 600.0000 mg | ORAL_TABLET | Freq: Four times a day (QID) | ORAL | Status: DC | PRN

## 2011-12-22 MED ORDER — THIAMINE HCL 100 MG PO TABS: 100.0000 mg | ORAL_TABLET | Freq: Every day | ORAL | Status: AC

## 2011-12-22 MED ORDER — TAMSULOSIN HCL 0.4 MG PO CAPS
0.4000 mg | ORAL_CAPSULE | Freq: Every day | ORAL | Status: DC
  Filled 2011-12-22: qty 14

## 2011-12-22 MED ORDER — TAMSULOSIN HCL 0.4 MG PO CAPS: ORAL_CAPSULE | ORAL | Status: DC

## 2011-12-22 MED ORDER — NICOTINE 21 MG/24HR TD PT24: 1.0000 | MEDICATED_PATCH | Freq: Every day | TRANSDERMAL | Status: AC

## 2011-12-22 MED ORDER — ADULT MULTIVITAMIN W/MINERALS CH: 1.0000 | ORAL_TABLET | Freq: Every day | ORAL | Status: DC

## 2011-12-22 MED ORDER — IBUPROFEN 600 MG PO TABS: 600.0000 mg | ORAL_TABLET | Freq: Four times a day (QID) | ORAL | Status: AC | PRN

## 2011-12-22 NOTE — Progress Notes (Signed)
Patient ID: Nathaniel Lewis, male   DOB: [birth year], 46 y.o.   MRN: [id number] Pt did not attend AA mtg tonight, because he knew one of the facilitators. "I know Nathaniel Lewis". Writer reminded pt that Nathaniel Lewis has been where he is referring to having a substance abuse issue. Pt states he plans to go to [facility] for o/p, then get into an [facility] and afterwards start AA.

## 2011-12-22 NOTE — Progress Notes (Signed)
Discharge instructions given to and reviewed with patient who verbalized understanding.  Patient has no complaints of pain, SI or HI.  Skin intact.  Patient states he will attend his follow-up appointment with [facility].  Patient retrieving items from locker and ride available to pick him up. Calynn Ferrero Terral

## 2011-12-22 NOTE — Treatment Plan (Signed)
Interdisciplinary Treatment Plan Update (Adult)  Date: 12/22/2011  Time Reviewed: 11:52 AM   Progress in Treatment: Attending groups: Yes Participating in groups: Yes Taking medication as prescribed: Yes Tolerating medication: Yes   Family/Significant othe contact made:   Patient understands diagnosis:  Yes Discussing patient identified problems/goals with staff:  Yes Medical problems stabilized or resolved:  Yes Denies suicidal/homicidal ideation: Yes  In tx team Issues/concerns per patient self-inventory:  Yes Other:  New problem(s) identified: N/A  Reason for Continuation of Hospitalization: Other; describe D/C today  Interventions implemented related to continuation of hospitalization:   Additional comments:  Estimated length of stay:  Discharge Plan:  New goal(s): N/A  Review of initial/current patient goals per problem list:   1.  Goal(s): Decrease depression   Met:  Yes  Target date:5/7  As evidenced [id number] states his mood is good today in tx team  Denies anxiety  2.  Goal (s): Get into an [facility] from here   Met:  Yes  Target date:5/7  As evidenced by: [name] reports he has an interview set up for 6PM  He feels confident that he will get into the [name], and has contacted a second house as a back-up plan  3.  Goal(s):  Met:  Yes  Target date:  As evidenced by:  4.  Goal(s):  Met:  Yes  Target date:  As evidenced by:  Attendees: Patient:  Nathaniel Lewis 12/22/2011 11:52AM  Family:     Physician:  Lupe Carney 12/22/2011 11:52 AM   Nursing:  Roswell Miners  12/22/2011 11:52 AM   Case Manager:  Richelle Ito, LCSW 12/22/2011 11:52 AM   Counselor:  Ronda Fairly, LCSWA 12/22/2011 11:52 AM   Other:  Lynann Bologna 12/22/2011 11:52 AM  Other:     Other:     Other:      Scribe for Treatment Team:   Ida Rogue, 12/22/2011 11:52 AM

## 2011-12-22 NOTE — Progress Notes (Signed)
12/22/2011         Time: 1415      Group Topic/Focus: The focus of this group is on discussing various styles of communication and communicating assertively using 'I' (feeling) statements.  Participation Level: Active  Participation Quality: Attentive  Affect: Blunted  Cognitive: Oriented  Additional Comments: None.   Brainard Highfill 12/22/2011 3:52 PM

## 2011-12-22 NOTE — Progress Notes (Signed)
Patient verbalizes sleeping well, a good appetite and a normal energy level. Patient active in groups and interacting with peers at this time.Patient states he "feels better than he has in a long while." Patient  Rates depression as 4/10 and hopelessness as 3/10. Patient states he is having agitation related to withdrawal. Patient denies SI/HI and A/V hallucinations. Patient given PRN eye drops for irritated eyes per his request. Patient verbalizes goals for discharge as "take meds for anxiety and depression, counseling, AA meetings, and start back getting some exercise." Patient given support and encouragement, patient also given education re medication compliance once discharged. Patient verbalizes understanding of teaching. Will continue to monitor.

## 2011-12-22 NOTE — Progress Notes (Signed)
[facility] Case Management Discharge Plan:  Will you be returning to the same living situation after discharge: No. At discharge, do you have transportation home?:Yes,  sister Do you have the ability to pay for your medications:Yes,  mental health  Interagency Information:     Release of information consent forms completed and in the chart;  Patient's signature needed at discharge.  Patient to Follow up at:  Follow-up Information    Follow up with [facility] on 12/24/2011. (Walk in at 8AM on Thursday to be opened for services)    Contact information:   [address]  [place]  [phone]         Patient denies SI/HI:   Yes,  yes    Safety Planning and Suicide Prevention discussed:  No.  Barrier to discharge identified:No.  Summary and Recommendations:   Nathaniel Lewis 12/22/2011, 9:59 AM

## 2011-12-22 NOTE — Progress Notes (Signed)
[facility] Group Notes:  (Counselor/Nursing/MHT/Case Management/Adjunct)  12/22/2011 1:25 PM  Type of Therapy:  Group Therapy  Participation Level:  Active  Participation Quality:  Appropriate and Attentive  Affect:  Depressed  Cognitive:  Oriented  Insight:  Limited  Engagement in Group:  Good  Engagement in Therapy:  Limited  Modes of Intervention:  Clarification, Problem-solving and Support  Summary of Progress/Problems:Focus of group discussion today was feelings related to one's diagnosis of substance abuse, anxiety, depression, and or other mental health issues. Much discussion centered on disease concept verses moral issue. Haven took notes and was interested in following discussion after coming into the group a few minutes after it began.  Patient shared that he is discharging today and has a plan to maintain sobriety until going to [facility] for treatment.       Clide Dales 12/22/2011, 1:25 PM
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
## 2011-12-29 NOTE — Discharge Summary (Signed)
Physician Discharge Summary Note  Patient:  Nathaniel Lewis is an 46 y.o., male MRN:  [id number] DOB:  May 27, 1966 Patient phone:  [phone] (home)  Patient address:   [address] [place] [address],   Date of Admission:  12/19/2011 Date of Discharge: 12/22/2011  Discharge Diagnoses:  AXIS I: Alcohol Dependence; Mood Disorder NOS  AXIS II: Deferred  AXIS III:  Past Medical History   Diagnosis  Date   .  Anxiety    .  ETOH abuse    AXIS IV: Moderate  AXIS V: 50  Level of Care:  OP  Hospital Course:   Talan presented requesting help with depression. He cited depressed mood as a trigger for relapsing on alcohol and cocaine. He had relapsed around October of 2013, after being sober for 4 months, after leaving [facility] recovery services in June. Since that time he had been drinking about a 12 pack of beer a couple times a week when he had enough money and transportation to access alcohol. Admitted to withdrawal symptoms if he cut back.   Patient was admitted for dual diagnosis unit and agreed to a trial of Celexa of 20 mg daily to address his depressive symptoms. He responded well to the Celexa with resolution of his depression and suicidal thoughts. He was cooperative and pleasant with peers. Group participation was satisfactory. He worked with our counselors on formulating a relapse prevention program.  He was started on Flomax for symptoms of urinary retention he described as slowly worsening over the previous two years and encouraged to follow-up with his primary physician.    Consults:  None  Significant Diagnostic Studies:  CBC and electrolytes normal. Initial alcohol screen 114 mg percent. Urine drug screen positive for cocaine. Liver enzymes: AST 66, ALT 138, alkaline phosphatase 64, total bilirubin 1.1.  Discharge Vitals:   Blood pressure 88/60, pulse 99, temperature 98.1 F (36.7 C), temperature source Oral, resp. rate 20, height 5\' 6"  (1.676 m), weight 66.679 kg  (147 lb).  Mental Status Exam: See Mental Status Examination and Suicide Risk Assessment completed by Attending Physician prior to discharge.  Discharge destination:  Home  Is patient on multiple antipsychotic therapies at discharge:  No   Has Patient had three or more failed trials of antipsychotic monotherapy by history:  No  Recommended Plan for Multiple Antipsychotic Therapies: N/A  Medication List  As of 12/29/2011  3:30 PM   TAKE these medications      Indication    citalopram 20 MG tablet   Commonly known as: CELEXA   Take 1 tablet (20 mg total) by mouth daily. For depression and anxiety.       ibuprofen 600 MG tablet   Commonly known as: ADVIL,MOTRIN   Take 1 tablet (600 mg total) by mouth every 6 (six) hours as needed for pain (back pain).       mulitivitamin with minerals Tabs   Take 1 tablet by mouth daily. Vitamin Supplement.       nicotine 21 mg/24hr patch   Commonly known as: NICODERM CQ - dosed in mg/24 hours   Place 1 patch onto the skin daily at 6 (six) AM. For smoking cessation. May remove at bedtime as desired.       Tamsulosin HCl 0.4 MG Caps   Commonly known as: FLOMAX   Take one capsule by mouth daily, for difficulty urinating.       tetrahydrozoline 0.05 % ophthalmic solution   Place 1 drop into both eyes 4 (  four) times daily as needed (for irritation).       thiamine 100 MG tablet   Take 1 tablet (100 mg total) by mouth daily. Vitamin supplement.            Follow-up Information    Follow up with [facility] on 12/24/2011. (Walk in at 8AM on Thursday to be opened for services)    Contact information:   [address]  [place]  [phone]         Follow-up recommendations:  Activity:  unrestricted Diet:  regular  Signed: Shena Vinluan A 12/29/2011, 3:30 PM

## 2014-06-01 ENCOUNTER — Emergency Department: Payer: Self-pay | Admitting: Emergency Medicine

## 2014-10-06 ENCOUNTER — Emergency Department: Payer: Self-pay | Admitting: Emergency Medicine

## 2014-12-06 ENCOUNTER — Emergency Department
Admit: 2014-12-06 | Disposition: A | Discharge status: Home / Self Care | Payer: Self-pay | Admitting: Emergency Medicine

## 2014-12-06 LAB — CBC WITH DIFFERENTIAL/PLATELET
BASOS PCT: 1.2 %
Basophil #: 0.1 10*3/uL (ref 0.0–0.1)
EOS ABS: 0.3 10*3/uL (ref 0.0–0.7)
EOS PCT: 5.5 %
HCT: 43.6 % (ref 40.0–52.0)
HGB: 14.9 g/dL (ref 13.0–18.0)
LYMPHS PCT: 30 %
Lymphocyte #: 1.5 10*3/uL (ref 1.0–3.6)
MCH: 31.8 pg (ref 26.0–34.0)
MCHC: 34.1 g/dL (ref 32.0–36.0)
MCV: 93 fL (ref 80–100)
MONOS PCT: 10.3 %
Monocyte #: 0.5 x10 3/mm (ref 0.2–1.0)
NEUTROS PCT: 53 %
Neutrophil #: 2.7 10*3/uL (ref 1.4–6.5)
Platelet: 175 10*3/uL (ref 150–440)
RBC: 4.67 10*6/uL (ref 4.40–5.90)
RDW: 12.8 % (ref 11.5–14.5)
WBC: 5 10*3/uL (ref 3.8–10.6)

## 2014-12-06 LAB — BASIC METABOLIC PANEL
Anion Gap: 6 — ABNORMAL LOW (ref 7–16)
BUN: 21 mg/dL — ABNORMAL HIGH
CO2: 31 mmol/L
CREATININE: 1.02 mg/dL
Calcium, Total: 9.3 mg/dL
Chloride: 105 mmol/L
EGFR (African American): >60
GLUCOSE: 99 mg/dL
Potassium: 4.6 mmol/L
SODIUM: 142 mmol/L

## 2016-04-02 ENCOUNTER — Emergency Department
Admission: EM | Admit: 2016-04-02 | Discharge: 2016-04-02 | Disposition: A | Discharge status: Home / Self Care | Payer: 59 | Attending: Emergency Medicine | Admitting: Emergency Medicine

## 2016-04-02 ENCOUNTER — Encounter: Payer: Self-pay | Admitting: Emergency Medicine

## 2016-04-02 DIAGNOSIS — F17200 Nicotine dependence, unspecified, uncomplicated: Secondary | ICD-10-CM | POA: Diagnosis not present

## 2016-04-02 DIAGNOSIS — F1010 Alcohol abuse, uncomplicated: Secondary | ICD-10-CM | POA: Diagnosis present

## 2016-04-02 NOTE — ED Notes (Signed)
MD at bedside. 

## 2016-04-02 NOTE — ED Triage Notes (Signed)
Pt [facility] ems from homeless shelter with complaints of alcohol withdrawal. Pt states he needs "IV fluids and ativan." Pt is calm and resting upon assessment.

## 2016-04-02 NOTE — ED Notes (Signed)
TTS at bedside. 

## 2016-04-02 NOTE — Progress Notes (Signed)
Per request of ER MD Scotty Court([place]), writer provided the pt. with information and instructions on how to access Outpatient Mental Health & Substance Abuse Treatment ([facility], [facility] and [facility])   Patient denies SI/HI and AV/H.   04/02/2016 Nathaniel FlashNicole Madex Seals, MS, NCC, LPCA Therapeutic Triage Specialist

## 2016-04-02 NOTE — ED Provider Notes (Signed)
[facility] Emergency Department Provider Note  ____________________________________________  Time seen: Approximately 5:40 PM  I have reviewed the triage vital signs and the nursing notes.   HISTORY  Chief Complaint Alcohol abuse   HPI Nathaniel Lewis is a 50 y.o. male who reports that he's been drinking approximately 20-24 beers worth of alcohol daily for the past week. This is all since he was fired from his job. He is living at a homeless shelter and currently unemployed. No SI HI or hallucinations. Last drink was about 1 PM today, so he's had about 6 beers today. No chest pain shortness breath nausea vomiting or abdominal pain. No bloody stool.  He requests Ativan so that he can detox himself at home.     Past Medical History:  Diagnosis Date  . Anxiety   . ETOH abuse      Patient Active Problem List   Diagnosis Date Noted  . Alcohol abuse 12/19/2011  . Depression 12/19/2011     History reviewed. No pertinent surgical history.   Prior to Admission medications   Medication Sig Start Date End Date Taking? Authorizing Provider  Multiple Vitamin (MULITIVITAMIN WITH MINERALS) TABS Take 1 tablet by mouth daily. Vitamin Supplement. 12/22/11   Viviann SpareMargaret A Scott, FNP  Tamsulosin HCl (FLOMAX) 0.4 MG CAPS Take one capsule by mouth daily, for difficulty urinating. 12/22/11   Viviann SpareMargaret A Scott, FNP     Allergies Penicillins   No family history on file.  Social History Social History  Substance Use Topics  . Smoking status: Current Every Day Smoker  . Smokeless tobacco: Not on file  . Alcohol use Yes    Review of Systems  Constitutional:   No fever or chills.  ENT:   No sore throat. No rhinorrhea. Cardiovascular:   No chest pain. Respiratory:   No dyspnea or cough. Gastrointestinal:   Negative for abdominal pain, vomiting and diarrhea.  Genitourinary:   Negative for dysuria or difficulty urinating. Musculoskeletal:   Negative for focal  pain or swelling Neurological:   Negative for headaches 10-point ROS otherwise negative.  ____________________________________________   PHYSICAL EXAM:  VITAL SIGNS: ED Triage Vitals  Enc Vitals Group     BP 04/02/16 1704 126/80     Pulse Rate 04/02/16 1704 84     Resp 04/02/16 1704 18     Temp 04/02/16 1704 98.7 F (37.1 C)     Temp Source 04/02/16 1704 Oral     SpO2 04/02/16 1704 95 %     Weight 04/02/16 1705 160 lb (72.6 kg)     Height 04/02/16 1705 5\' 9"  (1.753 m)     Head Circumference --      Peak Flow --      Pain Score 04/02/16 1706 7     Pain Loc --      Pain Edu? --      Excl. in GC? --     Vital signs reviewed, nursing assessments reviewed.   Constitutional:   Alert and oriented. Well appearing and in no distress. Eyes:   No scleral icterus. No conjunctival pallor. PERRL. EOMI.  No nystagmus. ENT   Head:   Normocephalic and atraumatic.No flushing   Nose:   No congestion/rhinnorhea. No septal hematoma   Mouth/Throat:   MMM, no pharyngeal erythema. No peritonsillar mass.    Neck:   No stridor. No SubQ emphysema. No meningismus. Hematological/Lymphatic/Immunilogical:   No cervical lymphadenopathy. Cardiovascular:   RRR. Symmetric bilateral radial and DP pulses.  No murmurs.  Respiratory:   Normal respiratory effort without tachypnea nor retractions. Breath sounds are clear and equal bilaterally. No wheezes/rales/rhonchi. Gastrointestinal:   Soft and nontender. Non distended. There is no CVA tenderness.  No rebound, rigidity, or guarding. Genitourinary:   deferred Musculoskeletal:   Nontender with normal range of motion in all extremities. No joint effusions.  No lower extremity tenderness.  No edema. Neurologic:   Normal speech and language.  CN 2-10 normal. Motor grossly intact. No tongue fasciculations No gross focal neurologic deficits are appreciated.  Skin:    Skin is warm, dry and intact. No rash noted.  No petechiae, purpura, or bullae. No  diaphoresis  ____________________________________________    LABS (pertinent positives/negatives) (all labs ordered are listed, but only abnormal results are displayed) Labs Reviewed - No data to display ____________________________________________   EKG    ____________________________________________    RADIOLOGY    ____________________________________________   PROCEDURES Procedures  ____________________________________________   INITIAL IMPRESSION / ASSESSMENT AND PLAN / ED COURSE  Pertinent labs & imaging results that were available during my care of the patient were reviewed by me and considered in my medical decision making (see chart for details).  Patient well appearing no acute distress. Presents for assistance with his alcohol abuse and possible dependence. No evidence of withdrawal. He is clinically sober. Medically stable, unremarkable vital signs, exam is benign and reassuring. We'll discharge home. Outpatient resources given to him for detox management. Referral information printed for the patient.     Clinical Course   ____________________________________________   FINAL CLINICAL IMPRESSION(S) / ED DIAGNOSES  Final diagnoses:  Alcohol abuse       Portions of this note were generated with dragon dictation software. Dictation errors may occur despite best attempts at proofreading.    Sharman CheekPhillip Muzammil Bruins, MD 04/02/16 [phone]

## 2017-02-21 ENCOUNTER — Encounter (HOSPITAL_COMMUNITY): Payer: Self-pay

## 2017-02-21 ENCOUNTER — Emergency Department (HOSPITAL_COMMUNITY)
Admission: EM | Admit: 2017-02-21 | Discharge: 2017-02-21 | Disposition: A | Discharge status: Home / Self Care | Payer: 59 | Attending: Emergency Medicine | Admitting: Emergency Medicine

## 2017-02-21 DIAGNOSIS — Z5321 Procedure and treatment not carried out due to patient leaving prior to being seen by health care provider: Secondary | ICD-10-CM | POA: Insufficient documentation

## 2017-02-21 NOTE — ED Notes (Signed)
Pt chose not to stay.  Pt had been told during triage by documenting RN that study may be required to r/o blood clot which can be more critical if not treated.
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
## 2018-01-30 ENCOUNTER — Encounter (HOSPITAL_COMMUNITY): Payer: Self-pay | Admitting: Nurse Practitioner

## 2018-01-30 ENCOUNTER — Emergency Department (HOSPITAL_COMMUNITY): Payer: 59

## 2018-01-30 ENCOUNTER — Emergency Department (HOSPITAL_COMMUNITY)
Admission: EM | Admit: 2018-01-30 | Discharge: 2018-01-31 | Disposition: A | Discharge status: Home / Self Care | Payer: 59 | Attending: Emergency Medicine | Admitting: Emergency Medicine

## 2018-01-30 DIAGNOSIS — S0990XA Unspecified injury of head, initial encounter: Secondary | ICD-10-CM | POA: Insufficient documentation

## 2018-01-30 DIAGNOSIS — Z79899 Other long term (current) drug therapy: Secondary | ICD-10-CM | POA: Insufficient documentation

## 2018-01-30 DIAGNOSIS — F1010 Alcohol abuse, uncomplicated: Secondary | ICD-10-CM

## 2018-01-30 DIAGNOSIS — F10920 Alcohol use, unspecified with intoxication, uncomplicated: Secondary | ICD-10-CM

## 2018-01-30 DIAGNOSIS — Y929 Unspecified place or not applicable: Secondary | ICD-10-CM | POA: Insufficient documentation

## 2018-01-30 DIAGNOSIS — Y9355 Activity, bike riding: Secondary | ICD-10-CM | POA: Insufficient documentation

## 2018-01-30 DIAGNOSIS — F10220 Alcohol dependence with intoxication, uncomplicated: Secondary | ICD-10-CM | POA: Insufficient documentation

## 2018-01-30 DIAGNOSIS — F17200 Nicotine dependence, unspecified, uncomplicated: Secondary | ICD-10-CM | POA: Insufficient documentation

## 2018-01-30 DIAGNOSIS — Y998 Other external cause status: Secondary | ICD-10-CM | POA: Insufficient documentation

## 2018-01-30 DIAGNOSIS — S0083XA Contusion of other part of head, initial encounter: Secondary | ICD-10-CM

## 2018-01-30 DIAGNOSIS — S0081XA Abrasion of other part of head, initial encounter: Secondary | ICD-10-CM | POA: Insufficient documentation

## 2018-01-30 LAB — CBC WITH DIFFERENTIAL/PLATELET
BASOS PCT: 1 %
Basophils Absolute: 0.1 10*3/uL (ref 0.0–0.1)
EOS ABS: 0.2 10*3/uL (ref 0.0–0.7)
Eosinophils Relative: 3 %
HCT: 41.2 % (ref 39.0–52.0)
HEMOGLOBIN: 14.6 g/dL (ref 13.0–17.0)
LYMPHS ABS: 2.3 10*3/uL (ref 0.7–4.0)
Lymphocytes Relative: 42 %
MCH: 32.7 pg (ref 26.0–34.0)
MCHC: 35.4 g/dL (ref 30.0–36.0)
MCV: 92.4 fL (ref 78.0–100.0)
Monocytes Absolute: 0.3 10*3/uL (ref 0.1–1.0)
Monocytes Relative: 5 %
NEUTROS ABS: 2.7 10*3/uL (ref 1.7–7.7)
Neutrophils Relative %: 49 %
Platelets: 170 10*3/uL (ref 150–400)
RBC: 4.46 MIL/uL (ref 4.22–5.81)
RDW: 12.9 % (ref 11.5–15.5)
WBC: 5.5 10*3/uL (ref 4.0–10.5)

## 2018-01-30 LAB — COMPREHENSIVE METABOLIC PANEL
ALBUMIN: 4.1 g/dL (ref 3.5–5.0)
ALK PHOS: 74 U/L (ref 38–126)
ALT: 244 U/L — ABNORMAL HIGH (ref 17–63)
AST: 293 U/L — ABNORMAL HIGH (ref 15–41)
Anion gap: 11 (ref 5–15)
BUN: 9 mg/dL (ref 6–20)
CALCIUM: 8.4 mg/dL — AB (ref 8.9–10.3)
CO2: 28 mmol/L (ref 22–32)
CREATININE: 0.95 mg/dL (ref 0.61–1.24)
Chloride: 104 mmol/L (ref 101–111)
GFR calc Af Amer: >60 mL/min (ref >60)
GFR calc non Af Amer: >60 mL/min (ref >60)
GLUCOSE: 123 mg/dL — AB (ref 65–99)
Potassium: 2.9 mmol/L — ABNORMAL LOW (ref 3.5–5.1)
SODIUM: 143 mmol/L (ref 135–145)
Total Bilirubin: 0.9 mg/dL (ref 0.3–1.2)
Total Protein: 8.2 g/dL — ABNORMAL HIGH (ref 6.5–8.1)

## 2018-01-30 LAB — ETHANOL: Alcohol, Ethyl (B): 372 mg/dL (ref <10)

## 2018-01-30 MED ORDER — LORAZEPAM 2 MG/ML IJ SOLN
0.0000 mg | Freq: Four times a day (QID) | INTRAMUSCULAR | Status: DC
  Administered 2018-01-30: 2 mg via INTRAVENOUS
  Filled 2018-01-30: qty 1

## 2018-01-30 MED ORDER — LORAZEPAM 1 MG PO TABS
0.0000 mg | ORAL_TABLET | Freq: Four times a day (QID) | ORAL | Status: DC
  Administered 2018-01-31: 2 mg via ORAL
  Filled 2018-01-30: qty 2

## 2018-01-30 MED ORDER — LORAZEPAM 2 MG/ML IJ SOLN
2.0000 mg | Freq: Once | INTRAMUSCULAR | Status: AC
  Administered 2018-01-30: 2 mg via INTRAVENOUS
  Filled 2018-01-30: qty 1

## 2018-01-30 MED ORDER — LORAZEPAM 1 MG PO TABS: 0.0000 mg | ORAL_TABLET | Freq: Two times a day (BID) | ORAL | Status: DC

## 2018-01-30 MED ORDER — LORAZEPAM 2 MG/ML IJ SOLN: 0.0000 mg | Freq: Two times a day (BID) | INTRAMUSCULAR | Status: DC

## 2018-01-30 MED ORDER — VITAMIN B-1 100 MG PO TABS: 100.0000 mg | ORAL_TABLET | Freq: Every day | ORAL | Status: DC

## 2018-01-30 MED ORDER — THIAMINE HCL 100 MG/ML IJ SOLN
Freq: Once | INTRAVENOUS | Status: AC
  Administered 2018-01-30: 23:00:00 via INTRAVENOUS
  Filled 2018-01-30: qty 1000

## 2018-01-30 MED ORDER — TETANUS-DIPHTH-ACELL PERTUSSIS 5-2.5-18.5 LF-MCG/0.5 IM SUSP
0.5000 mL | Freq: Once | INTRAMUSCULAR | Status: AC
  Administered 2018-01-30: 0.5 mL via INTRAMUSCULAR
  Filled 2018-01-30: qty 0.5

## 2018-01-30 MED ORDER — THIAMINE HCL 100 MG/ML IJ SOLN: 100.0000 mg | Freq: Every day | INTRAMUSCULAR | Status: DC
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
## 2018-01-31 LAB — RAPID URINE DRUG SCREEN, HOSP PERFORMED
AMPHETAMINES: NOT DETECTED
BENZODIAZEPINES: POSITIVE — AB
COCAINE: NOT DETECTED
OPIATES: NOT DETECTED
Tetrahydrocannabinol: NOT DETECTED

## 2018-01-31 MED ORDER — CHLORDIAZEPOXIDE HCL 25 MG PO CAPS: ORAL_CAPSULE | ORAL | 0 refills | Status: DC

## 2018-01-31 MED ORDER — POTASSIUM CHLORIDE CRYS ER 20 MEQ PO TBCR
40.0000 meq | EXTENDED_RELEASE_TABLET | Freq: Once | ORAL | Status: AC
  Administered 2018-01-31: 40 meq via ORAL
  Filled 2018-01-31: qty 2

## 2018-01-31 NOTE — ED Notes (Signed)
Patient pulled IV out twice.

## 2018-01-31 NOTE — ED Notes (Signed)
Bed: WA33 Expected date:  Expected time:  Means of arrival:  Comments: 

## 2018-03-19 ENCOUNTER — Encounter (HOSPITAL_COMMUNITY): Payer: Self-pay | Admitting: Emergency Medicine

## 2018-03-19 ENCOUNTER — Emergency Department (HOSPITAL_COMMUNITY)
Admission: EM | Admit: 2018-03-19 | Discharge: 2018-03-19 | Disposition: A | Discharge status: Home / Self Care | Payer: Self-pay | Attending: Emergency Medicine | Admitting: Emergency Medicine

## 2018-03-19 ENCOUNTER — Other Ambulatory Visit: Payer: Self-pay

## 2018-03-19 DIAGNOSIS — H6691 Otitis media, unspecified, right ear: Secondary | ICD-10-CM | POA: Insufficient documentation

## 2018-03-19 MED ORDER — AMOXICILLIN-POT CLAVULANATE 875-125 MG PO TABS: 1.0000 | ORAL_TABLET | Freq: Two times a day (BID) | ORAL | 0 refills | Status: AC

## 2018-03-19 MED ORDER — CIPROFLOXACIN-HYDROCORTISONE 0.2-1 % OT SUSP: 3.0000 [drp] | Freq: Two times a day (BID) | OTIC | 0 refills | Status: DC
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
## 2018-03-19 NOTE — Discharge Instructions (Addendum)
It is hard for me to tell on exam if you have ruptured your ear drum membrane because of the drainage coming from your ear. However, I will treat it as if it is and will give you safe antibiotic that will be used for that and for an ear infection.  You may use Tylenol and/or Ibuprofen for pain. Do not go swimming and try to avoid getting water in your right area. It is safe for you to place a cotton ball with Vaseline in that ear, but do not place any other objects in it.  Below is information for an ENT provider that you can follow-up with. If not, you can see your normal PCP and have her check you out.

## 2018-08-06 ENCOUNTER — Emergency Department (HOSPITAL_COMMUNITY)
Admission: EM | Admit: 2018-08-06 | Discharge: 2018-08-06 | Disposition: A | Discharge status: Home / Self Care | Payer: Self-pay | Attending: Emergency Medicine | Admitting: Emergency Medicine

## 2018-08-06 ENCOUNTER — Other Ambulatory Visit: Payer: Self-pay

## 2018-08-06 ENCOUNTER — Encounter (HOSPITAL_COMMUNITY): Payer: Self-pay | Admitting: Emergency Medicine

## 2018-08-06 DIAGNOSIS — F10929 Alcohol use, unspecified with intoxication, unspecified: Secondary | ICD-10-CM | POA: Insufficient documentation

## 2018-08-06 DIAGNOSIS — F1010 Alcohol abuse, uncomplicated: Secondary | ICD-10-CM

## 2018-08-06 DIAGNOSIS — F10920 Alcohol use, unspecified with intoxication, uncomplicated: Secondary | ICD-10-CM

## 2018-08-06 DIAGNOSIS — F17200 Nicotine dependence, unspecified, uncomplicated: Secondary | ICD-10-CM | POA: Insufficient documentation

## 2018-08-06 DIAGNOSIS — Z79899 Other long term (current) drug therapy: Secondary | ICD-10-CM | POA: Insufficient documentation

## 2018-08-06 MED ORDER — LORAZEPAM 1 MG PO TABS: 0.0000 mg | ORAL_TABLET | Freq: Two times a day (BID) | ORAL | Status: DC

## 2018-08-06 MED ORDER — LORAZEPAM 1 MG PO TABS
0.0000 mg | ORAL_TABLET | Freq: Four times a day (QID) | ORAL | Status: DC
  Administered 2018-08-06: 2 mg via ORAL
  Filled 2018-08-06: qty 2

## 2018-08-06 MED ORDER — SODIUM CHLORIDE 0.9 % IV SOLN: INTRAVENOUS | Status: DC

## 2018-08-06 MED ORDER — SODIUM CHLORIDE 0.9 % IV BOLUS: 500.0000 mL | Freq: Once | INTRAVENOUS | Status: DC

## 2018-08-06 MED ORDER — VITAMIN B-1 100 MG PO TABS
100.0000 mg | ORAL_TABLET | Freq: Every day | ORAL | Status: DC
  Administered 2018-08-06: 100 mg via ORAL
  Filled 2018-08-06: qty 1

## 2018-08-06 MED ORDER — LORAZEPAM 2 MG/ML IJ SOLN: 0.0000 mg | Freq: Four times a day (QID) | INTRAMUSCULAR | Status: DC

## 2018-08-06 MED ORDER — LORAZEPAM 2 MG/ML IJ SOLN: 0.0000 mg | Freq: Two times a day (BID) | INTRAMUSCULAR | Status: DC

## 2018-08-06 MED ORDER — THIAMINE HCL 100 MG/ML IJ SOLN: 100.0000 mg | Freq: Every day | INTRAMUSCULAR | Status: DC

## 2018-08-06 NOTE — ED Provider Notes (Addendum)
[facility] Provider Note   CSN: [id number] Arrival date & time: 08/06/18  2054     History   Chief Complaint Chief Complaint  Patient presents with  . Alcohol Intoxication  . Detox    HPI Nathaniel KaufmannFrankie Lewis Lewis is a 52 y.o. male.  HPI   Patient presents for evaluation of alcoholism and panic attacks.  He feels like his use of alcohol and Xanax are making him be out of control.  He wants to stop drinking alcohol feels like he needs some Ativan to help him do that.  He would like to be observed and treated here for that.  He has been going to AA, but relapsed recently.  He denies use of illegal drugs.  He denies recent illnesses.  There is been no chest pain, shortness of breath, weakness or dizziness.  There are no other known modifying factors.  Past Medical History:  Diagnosis Date  . Anxiety   . ETOH abuse     Patient Active Problem List   Diagnosis Date Noted  . Alcohol abuse 12/19/2011  . Depression 12/19/2011    History reviewed. No pertinent surgical history.      Home Medications    Prior to Admission medications   Medication Sig Start Date End Date Taking? Authorizing Provider  ALPRAZolam Prudy Feeler(XANAX) 0.5 MG tablet Take 0.5 mg by mouth 4 (four) times daily as needed for anxiety or sleep.  01/17/18   [provider]  buPROPion (WELLBUTRIN XL) 150 MG 24 hr tablet Take 150 mg by mouth daily. 11/21/17   [provider]  chlordiazePOXIDE (LIBRIUM) 25 MG capsule 50mg  PO TID x 1D, then 25-50mg  PO BID X 1D, then 25-50mg  PO QD X 1D 01/31/18   Upstill, Shari, PA-C  Multiple Vitamin (MULITIVITAMIN WITH MINERALS) TABS Take 1 tablet by mouth daily. Vitamin Supplement. 12/22/11   Viviann SpareScott, Margaret A, FNP  traMADol (ULTRAM) 50 MG tablet Take 50 mg by mouth 2 (two) times daily as needed for moderate pain.  12/27/17   [provider]  VENTOLIN HFA 108 (90 Base) MCG/ACT inhaler Inhale 1-2 puffs into the lungs 4 (four) times daily  as needed. 01/03/18   [provider]    Family History No family history on file.  Social History Social History   Tobacco Use  . Smoking status: Current Every Day Smoker  . Smokeless tobacco: Never Used  Substance Use Topics  . Alcohol use: Yes  . Drug use: No     Allergies   Penicillins   Review of Systems Review of Systems  All other systems reviewed and are negative.    Physical Exam Updated Vital Signs BP (!) 133/97 (BP Location: Left Arm)   Pulse (!) 109   Temp 98.7 F (37.1 C) (Oral)   Resp 18   Ht 5\' 9"  (1.753 m)   Wt 77.1 kg   SpO2 94%   BMI 25.10 kg/m   Physical Exam Vitals signs and nursing note reviewed.  Constitutional:      Appearance: Normal appearance. He is well-developed. He is not ill-appearing or diaphoretic.  HENT:     Head: Normocephalic and atraumatic.     Right Ear: External ear normal.     Left Ear: External ear normal.  Eyes:     Conjunctiva/sclera: Conjunctivae normal.     Pupils: Pupils are equal, round, and reactive to light.  Neck:     Musculoskeletal: Normal range of motion and neck supple.  Trachea: Phonation normal.  Cardiovascular:     Rate and Rhythm: Normal rate.  Pulmonary:     Effort: Pulmonary effort is normal.  Musculoskeletal: Normal range of motion.  Skin:    General: Skin is warm and dry.     Coloration: Skin is not jaundiced or pale.  Neurological:     Mental Status: He is alert and oriented to person, place, and time.     Cranial Nerves: No cranial nerve deficit.     Sensory: No sensory deficit.     Motor: No abnormal muscle tone.     Coordination: Coordination normal.     Comments: Dysarthria consistent with alcohol intoxication.  No aphasia or nystagmus.  Psychiatric:        Behavior: Behavior normal.        Thought Content: Thought content normal.      ED Treatments / Results  Labs (all labs ordered are listed, but only abnormal results are displayed) Labs Reviewed - No data to  display  EKG None  Radiology No results found.  Procedures Procedures (including critical care time)  Medications Ordered in ED Medications  LORazepam (ATIVAN) injection 0-4 mg ( Intravenous See Alternative 08/06/18 2226)    Or  LORazepam (ATIVAN) tablet 0-4 mg (2 mg Oral Given 08/06/18 2226)  LORazepam (ATIVAN) injection 0-4 mg (has no administration in time range)    Or  LORazepam (ATIVAN) tablet 0-4 mg (has no administration in time range)  thiamine (VITAMIN B-1) tablet 100 mg (100 mg Oral Given 08/06/18 2226)    Or  thiamine (B-1) injection 100 mg ( Intravenous See Alternative 08/06/18 2226)  sodium chloride 0.9 % bolus 500 mL (500 mLs Intravenous Not Given 08/06/18 2248)  0.9 %  sodium chloride infusion ( Intravenous Not Given 08/06/18 2248)     Initial Impression / Assessment and Plan / ED Course  I have reviewed the triage vital signs and the nursing notes.  Pertinent labs & imaging results that were available during my care of the patient were reviewed by me and considered in my medical decision making (see chart for details).  Clinical Course as of Aug 06 2248  Sat Aug 06, 2018  2224 IV fluids and CIWA protocol with as needed Ativan, and thiamine ordered.   [EW]  2248 Patient was smoking in the restroom, police became involved and want to take him to the drunk tank, for him to sober.   [EW]    Clinical Course User Index [EW] Mancel BaleWentz, Kiante Petrovich, MD     Patient Vitals for the past 24 hrs:  BP Temp Temp src Pulse Resp SpO2 Height Weight  08/06/18 2111 (!) 133/97 98.7 F (37.1 C) Oral (!) 109 18 94 % 5\' 9"  (1.753 m) 77.1 kg     Medical Decision Making: Chronic use of alcohol, benzodiazepines, and narcotics.  Patient arrives to the ED intoxicated.  He will be observed and screened for by me and withdrawal symptoms.  CRITICAL CARE-no Performed by: Mancel BaleElliott Lastacia Solum  Nursing Notes Reviewed/ Care Coordinated Applicable Imaging Reviewed Interpretation of Laboratory  Data incorporated into ED treatment  Plan-discharge with law enforcement, patient being taken to drunk tank at jail.  Final Clinical Impressions(s) / ED Diagnoses   Final diagnoses:  Alcoholic intoxication without complication [facility])  Alcohol abuse    ED Discharge Orders    None       Mancel BaleWentz, Cephas Revard, MD 08/06/18 2226    Mancel BaleWentz, Sabel Hornbeck, MD 08/06/18 2249    Mancel BaleWentz, Deloma Spindle, MD  08/07/18 1710  

## 2018-08-06 NOTE — ED Triage Notes (Signed)
Pt presents intoxicated wanting detox from "alcohol and drugs." Patient states he drinks about 8 bootleggers a day. Patient states he would like an IV to "flush all the drugs and alcohol" out of his system.

## 2018-08-06 NOTE — Discharge Instructions (Addendum)
Avoid all forms of alcohol. 

## 2018-08-08 DIAGNOSIS — F411 Generalized anxiety disorder: Secondary | ICD-10-CM | POA: Insufficient documentation

## 2018-08-08 DIAGNOSIS — F321 Major depressive disorder, single episode, moderate: Secondary | ICD-10-CM | POA: Insufficient documentation

## 2018-08-08 DIAGNOSIS — R569 Unspecified convulsions: Secondary | ICD-10-CM | POA: Insufficient documentation

## 2018-09-27 ENCOUNTER — Inpatient Hospital Stay (HOSPITAL_COMMUNITY)
Admission: EM | Admit: 2018-09-27 | Discharge: 2018-09-30 | DRG: 084 | Disposition: A | Discharge status: Home / Self Care | Payer: Self-pay | Attending: Neurosurgery | Admitting: Neurosurgery

## 2018-09-27 DIAGNOSIS — I609 Nontraumatic subarachnoid hemorrhage, unspecified: Secondary | ICD-10-CM

## 2018-09-27 DIAGNOSIS — S065X9A Traumatic subdural hemorrhage with loss of consciousness of unspecified duration, initial encounter: Principal | ICD-10-CM | POA: Diagnosis present

## 2018-09-27 DIAGNOSIS — Y9289 Other specified places as the place of occurrence of the external cause: Secondary | ICD-10-CM

## 2018-09-27 DIAGNOSIS — S02122A Fracture of orbital roof, left side, initial encounter for closed fracture: Secondary | ICD-10-CM | POA: Diagnosis present

## 2018-09-27 DIAGNOSIS — S065XAA Traumatic subdural hemorrhage with loss of consciousness status unknown, initial encounter: Secondary | ICD-10-CM | POA: Diagnosis present

## 2018-09-27 DIAGNOSIS — R402242 Coma scale, best verbal response, confused conversation, at arrival to emergency department: Secondary | ICD-10-CM | POA: Diagnosis present

## 2018-09-27 DIAGNOSIS — S0219XA Other fracture of base of skull, initial encounter for closed fracture: Secondary | ICD-10-CM | POA: Diagnosis present

## 2018-09-27 DIAGNOSIS — R402132 Coma scale, eyes open, to sound, at arrival to emergency department: Secondary | ICD-10-CM | POA: Diagnosis present

## 2018-09-27 DIAGNOSIS — S020XXA Fracture of vault of skull, initial encounter for closed fracture: Secondary | ICD-10-CM | POA: Diagnosis present

## 2018-09-27 DIAGNOSIS — F10129 Alcohol abuse with intoxication, unspecified: Secondary | ICD-10-CM | POA: Diagnosis present

## 2018-09-27 DIAGNOSIS — R402362 Coma scale, best motor response, obeys commands, at arrival to emergency department: Secondary | ICD-10-CM | POA: Diagnosis present

## 2018-09-27 LAB — BASIC METABOLIC PANEL
Anion gap: 12 (ref 5–15)
BUN: 17 mg/dL (ref 6–20)
CO2: 26 mmol/L (ref 22–32)
Calcium: 8.9 mg/dL (ref 8.9–10.3)
Chloride: 102 mmol/L (ref 98–111)
Creatinine, Ser: 1.14 mg/dL (ref 0.61–1.24)
GFR calc Af Amer: >60 mL/min (ref >60)
GFR calc non Af Amer: >60 mL/min (ref >60)
Glucose, Bld: 105 mg/dL — ABNORMAL HIGH (ref 70–99)
Potassium: 3.5 mmol/L (ref 3.5–5.1)
Sodium: 140 mmol/L (ref 135–145)

## 2018-09-27 LAB — CBC WITH DIFFERENTIAL/PLATELET
Abs Immature Granulocytes: 0.01 10*3/uL (ref 0.00–0.07)
Basophils Absolute: 0.1 10*3/uL (ref 0.0–0.1)
Basophils Relative: 1 %
Eosinophils Absolute: 0.5 10*3/uL (ref 0.0–0.5)
Eosinophils Relative: 6 %
HCT: 46.7 % (ref 39.0–52.0)
Hemoglobin: 15.6 g/dL (ref 13.0–17.0)
Immature Granulocytes: 0 %
Lymphocytes Relative: 41 %
Lymphs Abs: 3.6 10*3/uL (ref 0.7–4.0)
MCH: 29.8 pg (ref 26.0–34.0)
MCHC: 33.4 g/dL (ref 30.0–36.0)
MCV: 89.1 fL (ref 80.0–100.0)
Monocytes Absolute: 0.5 10*3/uL (ref 0.1–1.0)
Monocytes Relative: 6 %
Neutro Abs: 4.1 10*3/uL (ref 1.7–7.7)
Neutrophils Relative %: 46 %
Platelets: 193 10*3/uL (ref 150–400)
RBC: 5.24 MIL/uL (ref 4.22–5.81)
RDW: 12.4 % (ref 11.5–15.5)
WBC: 8.8 10*3/uL (ref 4.0–10.5)
nRBC: 0 % (ref 0.0–0.2)

## 2018-09-27 LAB — ETHANOL: Alcohol, Ethyl (B): 116 mg/dL — ABNORMAL HIGH (ref <10)

## 2018-09-27 MED ORDER — FENTANYL CITRATE (PF) 100 MCG/2ML IJ SOLN
50.0000 ug | Freq: Once | INTRAMUSCULAR | Status: AC
  Administered 2018-09-27: 50 ug via INTRAVENOUS
  Filled 2018-09-27: qty 2

## 2018-09-27 MED ORDER — LACTATED RINGERS IV BOLUS
1000.0000 mL | Freq: Once | INTRAVENOUS | Status: AC
  Administered 2018-09-27: 1000 mL via INTRAVENOUS

## 2018-09-27 MED ORDER — TETANUS-DIPHTH-ACELL PERTUSSIS 5-2.5-18.5 LF-MCG/0.5 IM SUSP
0.5000 mL | Freq: Once | INTRAMUSCULAR | Status: AC
  Administered 2018-09-27: 0.5 mL via INTRAMUSCULAR
  Filled 2018-09-27: qty 0.5
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
## 2018-09-27 NOTE — ED Notes (Signed)
Patient continues to call out in pain. States "My head and ear hurts. Help me. Make it stop." MD made aware.
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
## 2018-09-28 ENCOUNTER — Inpatient Hospital Stay (HOSPITAL_COMMUNITY): Payer: Self-pay

## 2018-09-28 ENCOUNTER — Emergency Department (HOSPITAL_COMMUNITY): Payer: Self-pay

## 2018-09-28 DIAGNOSIS — S065X9A Traumatic subdural hemorrhage with loss of consciousness of unspecified duration, initial encounter: Secondary | ICD-10-CM | POA: Diagnosis present

## 2018-09-28 DIAGNOSIS — S065XAA Traumatic subdural hemorrhage with loss of consciousness status unknown, initial encounter: Secondary | ICD-10-CM | POA: Diagnosis present

## 2018-09-28 LAB — BASIC METABOLIC PANEL
ANION GAP: 7 (ref 5–15)
BUN: 14 mg/dL (ref 6–20)
CALCIUM: 8.7 mg/dL — AB (ref 8.9–10.3)
CO2: 30 mmol/L (ref 22–32)
Chloride: 102 mmol/L (ref 98–111)
Creatinine, Ser: 0.96 mg/dL (ref 0.61–1.24)
GFR calc Af Amer: >60 mL/min (ref >60)
GLUCOSE: 125 mg/dL — AB (ref 70–99)
Potassium: 3.8 mmol/L (ref 3.5–5.1)
Sodium: 139 mmol/L (ref 135–145)

## 2018-09-28 LAB — CBC
HCT: 43.2 % (ref 39.0–52.0)
Hemoglobin: 14.9 g/dL (ref 13.0–17.0)
MCH: 30.4 pg (ref 26.0–34.0)
MCHC: 34.5 g/dL (ref 30.0–36.0)
MCV: 88.2 fL (ref 80.0–100.0)
Platelets: 190 10*3/uL (ref 150–400)
RBC: 4.9 MIL/uL (ref 4.22–5.81)
RDW: 12.4 % (ref 11.5–15.5)
WBC: 10.4 10*3/uL (ref 4.0–10.5)
nRBC: 0 % (ref 0.0–0.2)

## 2018-09-28 LAB — ABO/RH: ABO/RH(D): B POS

## 2018-09-28 LAB — TYPE AND SCREEN
ABO/RH(D): B POS
Antibody Screen: NEGATIVE

## 2018-09-28 LAB — HIV ANTIBODY (ROUTINE TESTING W REFLEX): HIV Screen 4th Generation wRfx: NONREACTIVE

## 2018-09-28 MED ORDER — HYDROMORPHONE HCL 1 MG/ML IJ SOLN
0.5000 mg | INTRAMUSCULAR | Status: DC | PRN
  Administered 2018-09-28 (×5): 0.5 mg via INTRAVENOUS
  Filled 2018-09-28 (×5): qty 1

## 2018-09-28 MED ORDER — HYDROMORPHONE HCL 1 MG/ML IJ SOLN
0.5000 mg | INTRAMUSCULAR | Status: DC | PRN
  Administered 2018-09-28 – 2018-09-30 (×11): 0.5 mg via INTRAVENOUS
  Filled 2018-09-28: qty 0.5
  Filled 2018-09-28: qty 1
  Filled 2018-09-28: qty 0.5
  Filled 2018-09-28 (×2): qty 1
  Filled 2018-09-28: qty 0.5
  Filled 2018-09-28: qty 1
  Filled 2018-09-28 (×2): qty 0.5
  Filled 2018-09-28: qty 1

## 2018-09-28 MED ORDER — PANTOPRAZOLE SODIUM 40 MG IV SOLR
40.0000 mg | Freq: Every day | INTRAVENOUS | Status: DC
  Administered 2018-09-28: 40 mg via INTRAVENOUS
  Filled 2018-09-28: qty 40

## 2018-09-28 MED ORDER — ONDANSETRON 4 MG PO TBDP
4.0000 mg | ORAL_TABLET | Freq: Four times a day (QID) | ORAL | Status: DC | PRN
  Administered 2018-09-29: 4 mg via ORAL
  Filled 2018-09-28: qty 1

## 2018-09-28 MED ORDER — IOHEXOL 300 MG/ML  SOLN
100.0000 mL | Freq: Once | INTRAMUSCULAR | Status: AC | PRN
  Administered 2018-09-28: 100 mL via INTRAVENOUS

## 2018-09-28 MED ORDER — POTASSIUM CHLORIDE IN NACL 20-0.9 MEQ/L-% IV SOLN
INTRAVENOUS | Status: DC
  Administered 2018-09-28 – 2018-09-29 (×4): via INTRAVENOUS
  Filled 2018-09-28 (×3): qty 1000

## 2018-09-28 MED ORDER — PANTOPRAZOLE SODIUM 40 MG PO TBEC
40.0000 mg | DELAYED_RELEASE_TABLET | Freq: Every day | ORAL | Status: DC
  Administered 2018-09-29: 40 mg via ORAL
  Filled 2018-09-28: qty 1

## 2018-09-28 MED ORDER — OXYCODONE-ACETAMINOPHEN 5-325 MG PO TABS
1.0000 | ORAL_TABLET | ORAL | Status: DC | PRN
  Administered 2018-09-28: 1 via ORAL
  Administered 2018-09-28 – 2018-09-30 (×7): 2 via ORAL
  Filled 2018-09-28 (×3): qty 2
  Filled 2018-09-28: qty 1
  Filled 2018-09-28 (×4): qty 2

## 2018-09-28 MED ORDER — ACETAMINOPHEN 325 MG PO TABS
650.0000 mg | ORAL_TABLET | ORAL | Status: DC | PRN
  Administered 2018-09-30: 650 mg via ORAL
  Filled 2018-09-28 (×2): qty 2

## 2018-09-28 MED ORDER — ONDANSETRON HCL 4 MG/2ML IJ SOLN
4.0000 mg | Freq: Four times a day (QID) | INTRAMUSCULAR | Status: DC | PRN
  Administered 2018-09-28 – 2018-09-29 (×2): 4 mg via INTRAVENOUS
  Filled 2018-09-28 (×2): qty 2

## 2018-09-28 NOTE — ED Notes (Signed)
Patient again, took all monitoring cords off and walked to the doorway complaining of pain and needing to use the bathroom. Escorted patient back to bed and given ice pack for pain as requested.

## 2018-09-28 NOTE — ED Notes (Signed)
Neuro Surgeon at bedside.  

## 2018-09-28 NOTE — ED Notes (Addendum)
O2 sats dropped to 88% after pain medication administration. Placed on 2L via nasal cannula.

## 2018-09-28 NOTE — ED Notes (Signed)
Patient out of bed walking around room. Pulled IV out and disconnect BP and ECG leads. Patient now sitting in bed eating dinner.
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
## 2018-09-28 NOTE — ED Notes (Addendum)
MD made aware of BP. Will continue to monitor at this time.
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
## 2018-09-29 LAB — MRSA PCR SCREENING: MRSA by PCR: POSITIVE — AB

## 2018-09-29 MED ORDER — ALPRAZOLAM 0.25 MG PO TABS
0.2500 mg | ORAL_TABLET | Freq: Four times a day (QID) | ORAL | Status: DC | PRN
  Administered 2018-09-29 – 2018-09-30 (×3): 0.25 mg via ORAL
  Filled 2018-09-29 (×3): qty 1

## 2018-09-29 MED ORDER — DIPHENHYDRAMINE HCL 25 MG PO CAPS
50.0000 mg | ORAL_CAPSULE | Freq: Four times a day (QID) | ORAL | Status: DC | PRN
  Administered 2018-09-29 – 2018-09-30 (×2): 50 mg via ORAL
  Filled 2018-09-29 (×3): qty 2

## 2018-09-29 MED ORDER — MUPIROCIN 2 % EX OINT
1.0000 "application " | TOPICAL_OINTMENT | Freq: Two times a day (BID) | CUTANEOUS | Status: DC
  Administered 2018-09-29 – 2018-09-30 (×2): 1 via NASAL
  Filled 2018-09-29: qty 22

## 2018-09-29 MED ORDER — CHLORHEXIDINE GLUCONATE CLOTH 2 % EX PADS
6.0000 | MEDICATED_PAD | Freq: Every day | CUTANEOUS | Status: DC
  Administered 2018-09-29 – 2018-09-30 (×2): 6 via TOPICAL

## 2018-09-29 MED ORDER — NICOTINE 21 MG/24HR TD PT24
21.0000 mg | MEDICATED_PATCH | Freq: Every day | TRANSDERMAL | Status: DC
  Administered 2018-09-29 – 2018-09-30 (×2): 21 mg via TRANSDERMAL
  Filled 2018-09-29 (×2): qty 1

## 2018-09-29 NOTE — Progress Notes (Signed)
Patient up eating breakfast.  Complains of some headache.  Afebrile.  Vital signs are stable.  Patient awake and alert.  He is oriented and reasonably appropriate.  Speech is slow but otherwise fluent.  Motor 5/5 bilaterally.  No pronator drift.  Overall stable.  Begin efforts at mobilization.  Transfer to floor.  Possible discharge home tomorrow.

## 2018-09-29 NOTE — Progress Notes (Addendum)
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  Patient is also a heavy smoker which she feels might be adding to his agitation tonight, will add in order for nicotine patch.  She is not sure if the patient is currently homeless or "staying with random people."  We did discuss that the patient is having difficulty urinating tonight and she also said, "prostate cancer runs high in our family, and we just had a family member pass away from that."  Reassured her that we would address this issue with the doctor in the a.m.Marland Kitchen  Sisters name is Claris Gower and she is listed in the emergency contact information if you would like to speak with her.

## 2018-09-30 ENCOUNTER — Encounter (HOSPITAL_COMMUNITY): Payer: Self-pay | Admitting: *Deleted

## 2018-09-30 MED ORDER — NICOTINE 21 MG/24HR TD PT24: 21.0000 mg | MEDICATED_PATCH | Freq: Every day | TRANSDERMAL | 0 refills | Status: DC

## 2018-09-30 MED ORDER — OXYCODONE-ACETAMINOPHEN 5-325 MG PO TABS: 1.0000 | ORAL_TABLET | Freq: Four times a day (QID) | ORAL | 0 refills | Status: DC | PRN

## 2018-09-30 NOTE — Progress Notes (Signed)
Pt walked off floor with son after changing clothes and discharge instructions given, IV lines removed, pt refused to wait for staff to get a wheelchair and refused to leave with staff assistance. Jorge Ny
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
## 2018-09-30 NOTE — Progress Notes (Signed)
Patient requesting wallet; RN handed sister, Claris Gower, his wallet from his belonging bag and left the room.
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
## 2018-10-02 ENCOUNTER — Emergency Department (HOSPITAL_COMMUNITY)
Admission: EM | Admit: 2018-10-02 | Discharge: 2018-10-02 | Disposition: A | Discharge status: Home / Self Care | Payer: Self-pay | Attending: Emergency Medicine | Admitting: Emergency Medicine

## 2018-10-02 ENCOUNTER — Emergency Department (HOSPITAL_COMMUNITY): Payer: Self-pay

## 2018-10-02 ENCOUNTER — Encounter (HOSPITAL_COMMUNITY): Payer: Self-pay | Admitting: Emergency Medicine

## 2018-10-02 DIAGNOSIS — Y33XXXD Other specified events, undetermined intent, subsequent encounter: Secondary | ICD-10-CM | POA: Insufficient documentation

## 2018-10-02 DIAGNOSIS — S060X0D Concussion without loss of consciousness, subsequent encounter: Secondary | ICD-10-CM | POA: Insufficient documentation

## 2018-10-02 DIAGNOSIS — Z79899 Other long term (current) drug therapy: Secondary | ICD-10-CM | POA: Insufficient documentation

## 2018-10-02 DIAGNOSIS — F17200 Nicotine dependence, unspecified, uncomplicated: Secondary | ICD-10-CM | POA: Insufficient documentation

## 2018-10-02 LAB — CBC WITH DIFFERENTIAL/PLATELET
ABS IMMATURE GRANULOCYTES: 0.02 10*3/uL (ref 0.00–0.07)
Basophils Absolute: 0.1 10*3/uL (ref 0.0–0.1)
Basophils Relative: 1 %
Eosinophils Absolute: 0.2 10*3/uL (ref 0.0–0.5)
Eosinophils Relative: 2 %
HCT: 42.3 % (ref 39.0–52.0)
Hemoglobin: 14.3 g/dL (ref 13.0–17.0)
Immature Granulocytes: 0 %
Lymphocytes Relative: 26 %
Lymphs Abs: 2.3 10*3/uL (ref 0.7–4.0)
MCH: 29.7 pg (ref 26.0–34.0)
MCHC: 33.8 g/dL (ref 30.0–36.0)
MCV: 87.8 fL (ref 80.0–100.0)
MONO ABS: 0.7 10*3/uL (ref 0.1–1.0)
Monocytes Relative: 8 %
Neutro Abs: 5.5 10*3/uL (ref 1.7–7.7)
Neutrophils Relative %: 63 %
Platelets: 236 10*3/uL (ref 150–400)
RBC: 4.82 MIL/uL (ref 4.22–5.81)
RDW: 11.9 % (ref 11.5–15.5)
WBC: 8.6 10*3/uL (ref 4.0–10.5)
nRBC: 0 % (ref 0.0–0.2)

## 2018-10-02 LAB — RAPID URINE DRUG SCREEN, HOSP PERFORMED
Amphetamines: NOT DETECTED
Barbiturates: NOT DETECTED
Benzodiazepines: POSITIVE — AB
Cocaine: NOT DETECTED
Opiates: POSITIVE — AB
Tetrahydrocannabinol: NOT DETECTED

## 2018-10-02 LAB — BASIC METABOLIC PANEL
Anion gap: 12 (ref 5–15)
BUN: 10 mg/dL (ref 6–20)
CO2: 28 mmol/L (ref 22–32)
Calcium: 9.2 mg/dL (ref 8.9–10.3)
Chloride: 96 mmol/L — ABNORMAL LOW (ref 98–111)
Creatinine, Ser: 0.94 mg/dL (ref 0.61–1.24)
GFR calc Af Amer: >60 mL/min (ref >60)
GFR calc non Af Amer: >60 mL/min (ref >60)
Glucose, Bld: 134 mg/dL — ABNORMAL HIGH (ref 70–99)
POTASSIUM: 3.3 mmol/L — AB (ref 3.5–5.1)
Sodium: 136 mmol/L (ref 135–145)

## 2018-10-02 LAB — ETHANOL: Alcohol, Ethyl (B): <10 mg/dL (ref <10)

## 2018-10-02 MED ORDER — SODIUM CHLORIDE 0.9 % IV BOLUS
1000.0000 mL | Freq: Once | INTRAVENOUS | Status: AC
  Administered 2018-10-02: 1000 mL via INTRAVENOUS

## 2018-10-02 MED ORDER — NALOXONE HCL 4 MG/0.1ML NA LIQD: NASAL | 0 refills | Status: DC
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
## 2018-10-02 NOTE — ED Triage Notes (Signed)
Pt arrives via [facility] for c/o ongoing headache since a "brain injury" last week which he states he was seen here for. Ems reports that patient took a xanax upon pulling in and has a white powder substance on the tip of his nose. Per EMS, patient stated that "dilaudid and oxycodone are the only combination of meds that help my pain." pt a/ox4 but lethargic upon arrival.
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
## 2018-10-02 NOTE — ED Notes (Signed)
Pt attempting to give urine specimen

## 2018-10-02 NOTE — ED Notes (Signed)
Taken to CT.

## 2018-10-03 ENCOUNTER — Encounter (HOSPITAL_COMMUNITY): Payer: Self-pay | Admitting: Emergency Medicine

## 2018-10-04 ENCOUNTER — Other Ambulatory Visit: Payer: Self-pay

## 2018-10-04 ENCOUNTER — Emergency Department (HOSPITAL_COMMUNITY)
Admission: EM | Admit: 2018-10-04 | Discharge: 2018-10-04 | Disposition: A | Discharge status: Home / Self Care | Payer: Self-pay | Attending: Emergency Medicine | Admitting: Emergency Medicine

## 2018-10-04 ENCOUNTER — Encounter (HOSPITAL_COMMUNITY): Payer: Self-pay | Admitting: *Deleted

## 2018-10-04 ENCOUNTER — Encounter (HOSPITAL_COMMUNITY): Payer: Self-pay | Admitting: Emergency Medicine

## 2018-10-04 DIAGNOSIS — Z79899 Other long term (current) drug therapy: Secondary | ICD-10-CM | POA: Insufficient documentation

## 2018-10-04 DIAGNOSIS — H9209 Otalgia, unspecified ear: Secondary | ICD-10-CM | POA: Insufficient documentation

## 2018-10-04 DIAGNOSIS — F17200 Nicotine dependence, unspecified, uncomplicated: Secondary | ICD-10-CM | POA: Insufficient documentation

## 2018-10-04 DIAGNOSIS — F0781 Postconcussional syndrome: Secondary | ICD-10-CM | POA: Insufficient documentation

## 2018-10-04 DIAGNOSIS — Z5321 Procedure and treatment not carried out due to patient leaving prior to being seen by health care provider: Secondary | ICD-10-CM | POA: Insufficient documentation

## 2018-10-04 DIAGNOSIS — S069X0D Unspecified intracranial injury without loss of consciousness, subsequent encounter: Secondary | ICD-10-CM | POA: Insufficient documentation

## 2018-10-04 DIAGNOSIS — R51 Headache: Secondary | ICD-10-CM | POA: Insufficient documentation

## 2018-10-04 DIAGNOSIS — X58XXXD Exposure to other specified factors, subsequent encounter: Secondary | ICD-10-CM | POA: Insufficient documentation

## 2018-10-04 MED ORDER — ACETAMINOPHEN 325 MG PO TABS
650.0000 mg | ORAL_TABLET | Freq: Once | ORAL | Status: AC
  Administered 2018-10-04: 650 mg via ORAL
  Filled 2018-10-04: qty 2
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
## 2018-10-04 NOTE — ED Triage Notes (Signed)
Pt reports has hx brain injury and ear infection and c/o head/ear pain that "wont stop". Pt reports drank beer today with ibuprofen to try to help with pain. Pt reports that was given Diluadid and another medication via IV every 3 hours and would help with the pain but cant get anymore pain medications to help with the pain.

## 2018-10-04 NOTE — ED Notes (Signed)
Patient verbalizes understanding of discharge instructions. Opportunity for questioning and answers were provided. Armband removed by staff, pt discharged from ED ambulatory.   

## 2018-10-04 NOTE — Discharge Instructions (Signed)
You were seen in the ER for headaches . Unfortunately, you will have continue to have headaches because of your traumatic brain injury. Continue to see your primary care doctor for further evaluation of your pain.

## 2018-10-04 NOTE — ED Triage Notes (Signed)
Pt arrived by EMS for ongoing headache for the past week. Pt recently seen and admitted "for a brain bleed" per patient. Has taken ativan and 1800 ibuprofen without relief. Stumbling around triage requesting pain medication

## 2018-10-05 ENCOUNTER — Emergency Department (HOSPITAL_COMMUNITY)
Admission: EM | Admit: 2018-10-05 | Discharge: 2018-10-05 | Disposition: A | Discharge status: Home / Self Care | Payer: Self-pay | Attending: Emergency Medicine | Admitting: Emergency Medicine

## 2018-10-05 NOTE — ED Notes (Signed)
Pt called to have vitals assesed with no response.  RN notified.

## 2018-12-19 ENCOUNTER — Emergency Department (HOSPITAL_COMMUNITY)
Admission: EM | Admit: 2018-12-19 | Discharge: 2018-12-19 | Disposition: A | Discharge status: Home / Self Care | Payer: Self-pay | Attending: Emergency Medicine | Admitting: Emergency Medicine

## 2018-12-19 ENCOUNTER — Other Ambulatory Visit: Payer: Self-pay

## 2018-12-19 ENCOUNTER — Encounter (HOSPITAL_COMMUNITY): Payer: Self-pay | Admitting: Emergency Medicine

## 2018-12-19 DIAGNOSIS — R45851 Suicidal ideations: Secondary | ICD-10-CM | POA: Insufficient documentation

## 2018-12-19 DIAGNOSIS — F1320 Sedative, hypnotic or anxiolytic dependence, uncomplicated: Secondary | ICD-10-CM | POA: Insufficient documentation

## 2018-12-19 DIAGNOSIS — F314 Bipolar disorder, current episode depressed, severe, without psychotic features: Secondary | ICD-10-CM | POA: Insufficient documentation

## 2018-12-19 DIAGNOSIS — Y906 Blood alcohol level of 120-199 mg/100 ml: Secondary | ICD-10-CM | POA: Insufficient documentation

## 2018-12-19 DIAGNOSIS — F17200 Nicotine dependence, unspecified, uncomplicated: Secondary | ICD-10-CM | POA: Insufficient documentation

## 2018-12-19 DIAGNOSIS — F10920 Alcohol use, unspecified with intoxication, uncomplicated: Secondary | ICD-10-CM | POA: Insufficient documentation

## 2018-12-19 DIAGNOSIS — F1020 Alcohol dependence, uncomplicated: Secondary | ICD-10-CM

## 2018-12-19 DIAGNOSIS — Z79899 Other long term (current) drug therapy: Secondary | ICD-10-CM | POA: Insufficient documentation

## 2018-12-19 LAB — CBC
HCT: 43.2 % (ref 39.0–52.0)
Hemoglobin: 15.1 g/dL (ref 13.0–17.0)
MCH: 31.7 pg (ref 26.0–34.0)
MCHC: 35 g/dL (ref 30.0–36.0)
MCV: 90.6 fL (ref 80.0–100.0)
Platelets: 195 10*3/uL (ref 150–400)
RBC: 4.77 MIL/uL (ref 4.22–5.81)
RDW: 11.9 % (ref 11.5–15.5)
WBC: 8.2 10*3/uL (ref 4.0–10.5)
nRBC: 0 % (ref 0.0–0.2)

## 2018-12-19 LAB — RAPID URINE DRUG SCREEN, HOSP PERFORMED
Amphetamines: NOT DETECTED
Barbiturates: NOT DETECTED
Benzodiazepines: POSITIVE — AB
Cocaine: NOT DETECTED
Opiates: NOT DETECTED
Tetrahydrocannabinol: NOT DETECTED

## 2018-12-19 LAB — COMPREHENSIVE METABOLIC PANEL
ALT: 124 U/L — ABNORMAL HIGH (ref 0–44)
AST: 137 U/L — ABNORMAL HIGH (ref 15–41)
Albumin: 3.6 g/dL (ref 3.5–5.0)
Alkaline Phosphatase: 80 U/L (ref 38–126)
Anion gap: 11 (ref 5–15)
BUN: 11 mg/dL (ref 6–20)
CO2: 25 mmol/L (ref 22–32)
Calcium: 8.5 mg/dL — ABNORMAL LOW (ref 8.9–10.3)
Chloride: 105 mmol/L (ref 98–111)
Creatinine, Ser: 0.97 mg/dL (ref 0.61–1.24)
GFR calc Af Amer: >60 mL/min (ref >60)
GFR calc non Af Amer: >60 mL/min (ref >60)
Glucose, Bld: 102 mg/dL — ABNORMAL HIGH (ref 70–99)
Potassium: 3.2 mmol/L — ABNORMAL LOW (ref 3.5–5.1)
Sodium: 141 mmol/L (ref 135–145)
Total Bilirubin: 0.7 mg/dL (ref 0.3–1.2)
Total Protein: 8 g/dL (ref 6.5–8.1)

## 2018-12-19 LAB — SALICYLATE LEVEL: Salicylate Lvl: <7 mg/dL (ref 2.8–30.0)

## 2018-12-19 LAB — ETHANOL: Alcohol, Ethyl (B): 154 mg/dL — ABNORMAL HIGH (ref <10)

## 2018-12-19 LAB — ACETAMINOPHEN LEVEL: Acetaminophen (Tylenol), Serum: <10 ug/mL — ABNORMAL LOW (ref 10–30)

## 2018-12-19 MED ORDER — LORAZEPAM 2 MG/ML IJ SOLN: 0.0000 mg | Freq: Two times a day (BID) | INTRAMUSCULAR | Status: DC

## 2018-12-19 MED ORDER — LORAZEPAM 1 MG PO TABS: 0.0000 mg | ORAL_TABLET | Freq: Two times a day (BID) | ORAL | Status: DC

## 2018-12-19 MED ORDER — NICOTINE 21 MG/24HR TD PT24: 21.0000 mg | MEDICATED_PATCH | Freq: Every day | TRANSDERMAL | Status: DC

## 2018-12-19 MED ORDER — BENZTROPINE MESYLATE 1 MG PO TABS
1.0000 mg | ORAL_TABLET | Freq: Four times a day (QID) | ORAL | Status: DC | PRN
  Administered 2018-12-19: 1 mg via ORAL
  Filled 2018-12-19: qty 1

## 2018-12-19 MED ORDER — LORAZEPAM 2 MG/ML IJ SOLN: 0.0000 mg | Freq: Four times a day (QID) | INTRAMUSCULAR | Status: DC

## 2018-12-19 MED ORDER — LORAZEPAM 1 MG PO TABS: 0.0000 mg | ORAL_TABLET | Freq: Four times a day (QID) | ORAL | Status: DC

## 2018-12-19 MED ORDER — THIAMINE HCL 100 MG/ML IJ SOLN: 100.0000 mg | Freq: Every day | INTRAMUSCULAR | Status: DC

## 2018-12-19 MED ORDER — HALOPERIDOL 5 MG PO TABS
5.0000 mg | ORAL_TABLET | Freq: Four times a day (QID) | ORAL | Status: DC | PRN
  Administered 2018-12-19: 5 mg via ORAL
  Filled 2018-12-19: qty 1

## 2018-12-19 MED ORDER — VITAMIN B-1 100 MG PO TABS
100.0000 mg | ORAL_TABLET | Freq: Every day | ORAL | Status: DC
  Administered 2018-12-19: 100 mg via ORAL
  Filled 2018-12-19: qty 1

## 2018-12-19 NOTE — [facility] Assessment (Signed)
[facility] Assessment Progress Note  Per Jacqueline Norman, DO, this pt does not require psychiatric hospitalization at this time.  Pt is to be discharged from [facility] with referral information for area substance abuse treatment providers.  This has been included in pt's discharge instructions.  Pt would also benefit from seeing [facility] Support Specialists; they will be asked to speak to pt.  Pt's nurse has been notified.  Henslee Lottman, MA Triage Specialist [phone]     

## 2018-12-19 NOTE — ED Notes (Signed)
Tele-psych at bedside.

## 2018-12-19 NOTE — ED Notes (Signed)
[facility] assessment deferred at this time., pt currently sleeping. Safety sitter present. Will continue to monitor.

## 2018-12-19 NOTE — ED Notes (Signed)
Received called from [facility], he said recommendation is for pt to be observed and seen by psych. He communicated this w Dr. Blinda Leatherwood.

## 2018-12-19 NOTE — BHH Suicide Risk Assessment (Signed)
[facility]   Principal Problem: Alcohol use disorder, severe, dependence (HCC) Discharge Diagnoses: Principal Problem:   Alcohol use disorder, severe, dependence (HCC)   Total Time spent with patient: 30 minutes   [name] presented to the hospital with alcohol intoxication. BAL was 154 on admission. He endorsed passive SI. Today, he reports that he would like detox for alcohol use. [facility] and then [facility] to the [facility]. He is currently living in a motel. He reports that he relapsed 3 weeks ago. He has been drinking six to eight 25 ounce beers daily. His longest period of sobriety was 9 months. He reports a history of depression and BPAD. He denies SI, HI or AVH. He denies a history of suicide attempts. He is prescribed Xanax by his PCP. He does not believe that it was a good idea to restart Xanax because he subsequently relapsed on alcohol after starting Xanax.   Musculoskeletal: Strength & Muscle Tone: within normal limits Gait & Station: UTA since patient is lying in bed. Patient leans: N/A  Psychiatric Specialty Exam: Review of Systems  Psychiatric/Behavioral: Positive for depression and substance abuse. Negative for hallucinations and suicidal ideas.  All other systems reviewed and are negative.   Blood pressure (!) 137/99, pulse 79, temperature 98.6 F (37 C), temperature source Oral, resp. rate 18, height 5\' 10"  (1.778 m), weight 74.8 kg, SpO2 94 %.Body mass index is 23.68 kg/m.  General Appearance: Fairly Groomed, middle aged, Caucasian male, wearing paper hospital scrubs who is sitting in bed. NAD.   Eye Contact::  Good  Speech:  Clear and Coherent and Normal Rate  Volume:  Normal  Mood:  Depressed  Affect:  Constricted  Thought Process:  Goal Directed, Linear and Descriptions of Associations: Intact  Orientation:  Full (Time, Place, and Person)  Thought Content:  Logical  Suicidal Thoughts:  No   Homicidal Thoughts:  No  Memory:  Immediate;   Good Recent;   Good Remote;   Good  Judgement:  Fair  Insight:  Fair  Psychomotor Activity:  Normal  Concentration:  Good  Recall:  Good  Fund of Knowledge:Good  Language: Good  Akathisia:  No  Handed:  Right  AIMS (if indicated):   N/A  Assets:  Communication Skills Desire for Improvement Housing Physical Health Resilience  Sleep:   N/A  Cognition: WNL  ADL's:  Intact   Mental Status Per Nursing Assessment::   On Admission:   "Pt is dressed in hospital scrubs, alert and oriented x4. He appears intoxicated. Pt speaks in a slightly slurred tone, at moderate volume and normal pace. Motor behavior appears normal. Eye contact is good. Pt's mood is depressed and affect is congruent with mood. Thought process is coherent and relevant. There is no indication Pt is currently responding to internal stimuli or experiencing delusional thought content. Pt was pleasant and cooperative throughout assessment. He says he wants to be admitted to a psychiatric facility."  Demographic Factors:  Male, Caucasian, Low socioeconomic status, Living alone and Unemployed  Loss Factors: Financial problems/change in socioeconomic status  Historical Factors: Family history of mental illness or substance abuse  Risk Reduction Factors:   Positive therapeutic relationship  Continued Clinical Symptoms:  Alcohol/Substance Abuse/Dependencies More than one psychiatric diagnosis Previous Psychiatric Diagnoses and Treatments  Cognitive Features That Contribute To Risk:  None    Suicide Risk:  Minimal: No identifiable suicidal ideation.  Patients presenting with no risk factors but with  morbid ruminations; may be classified as minimal risk based on the severity of the depressive symptoms  Assessment:  Nathaniel Lewis is a 53 y.o. male who was admitted with passive SI in the setting of alcohol intoxication. Today he denies SI, HI or AVH. He is future  oriented and is help seeking. He will be provided with resources for alcohol abuse.   Plan Of Care/Follow-up recommendations:  -Patient will be referred to peer support for substance abuse resources.   Cherly Beach, DO 12/19/2018, 1:28 PM

## 2018-12-19 NOTE — ED Notes (Signed)
Patient given meal tray. Patient eating at bedside.

## 2018-12-19 NOTE — ED Triage Notes (Signed)
-  Pt said detoxing for several weeks. Larey Seat off wagon today, drank 7 beers, and 5mg  xanax. York Spaniel he needs to come to hospital for detox. Hx bipolar and depression. Been off meds.

## 2018-12-19 NOTE — ED Notes (Signed)
Pt d/c home per MD order. Discharge summary reviewed.Not endorsing SI/HI. Ambulatory off unit. Personal property returned.

## 2018-12-19 NOTE — ED Notes (Signed)
Pt standing in hallway cursing loudly at staff. Has been asked multiple times to return to his room.

## 2018-12-19 NOTE — ED Provider Notes (Signed)
[facility]-EMERGENCY DEPT Provider Note   CSN: [id number] Arrival date & time: 12/19/18  0056    History   Chief Complaint Chief Complaint  Patient presents with  . Alcohol Intoxication    HPI Nathaniel Lewis is a 53 y.o. male.     Patient presents to the emergency department for evaluation of alcohol intoxication and depression.  Patient has a long history of alcoholism.  He apparently had gone through detox recently but then drank heavily tonight.  He also took Xanax which he apparently at some point has had a prescription for.  He comes in by EMS.  He told EMS that he wanted to come to the hospital for detox.  He does report that he has been having waxing and waning symptoms of depression and occasional thoughts of suicidality but currently is not suicidal.     Past Medical History:  Diagnosis Date  . Anxiety   . ETOH abuse     Patient Active Problem List   Diagnosis Date Noted  . SDH (subdural hematoma) (HCC) 09/28/2018  . Alcohol abuse 12/19/2011  . Depression 12/19/2011    History reviewed. No pertinent surgical history.      Home Medications    Prior to Admission medications   Medication Sig Start Date End Date Taking? Authorizing Provider  ALPRAZolam Prudy Feeler(XANAX) 0.5 MG tablet Take 0.5 mg by mouth 4 (four) times daily as needed for anxiety or sleep.  01/17/18   [provider]  ALPRAZolam Prudy Feeler(XANAX) 0.5 MG tablet Take 0.5 mg by mouth 5 (five) times daily.    [provider]  buPROPion (WELLBUTRIN XL) 150 MG 24 hr tablet Take 150 mg by mouth daily. 11/21/17   [provider]  chlordiazePOXIDE (LIBRIUM) 25 MG capsule 50mg  PO TID x 1D, then 25-50mg  PO BID X 1D, then 25-50mg  PO QD X 1D 01/31/18   Upstill, Shari, PA-C  Multiple Vitamin (MULITIVITAMIN WITH MINERALS) TABS Take 1 tablet by mouth daily. Vitamin Supplement. 12/22/11   Viviann SpareScott, Margaret A, FNP  naloxone [facility](NARCAN) nasal spray 4 mg/0.1 mL As insert says 10/02/18    Curatolo, Adam, DO  nicotine (NICODERM CQ - DOSED IN MG/24 HOURS) 21 mg/24hr patch Place 1 patch (21 mg total) onto the skin daily. 10/01/18   Val EagleBergman, Meghan D, NP  oxyCODONE-acetaminophen (PERCOCET/ROXICET) 5-325 MG tablet Take 1-2 tablets by mouth every 6 (six) hours as needed for moderate pain or severe pain. 09/30/18   Val EagleBergman, Meghan D, NP  traMADol (ULTRAM) 50 MG tablet Take 50 mg by mouth 2 (two) times daily as needed for moderate pain.  12/27/17   [provider]  VENTOLIN HFA 108 (90 Base) MCG/ACT inhaler Inhale 1-2 puffs into the lungs 4 (four) times daily as needed. 01/03/18   [provider]    Family History History reviewed. No pertinent family history.  Social History Social History   Tobacco Use  . Smoking status: Current Every Day Smoker  . Smokeless tobacco: Never Used  Substance Use Topics  . Alcohol use: Yes  . Drug use: No     Allergies   Penicillins   Review of Systems Review of Systems  Psychiatric/Behavioral: Positive for dysphoric mood and suicidal ideas.  All other systems reviewed and are negative.    Physical Exam Updated Vital Signs BP (!) 135/93   Pulse 89   Resp 15   Ht 5\' 10"  (1.778 m)   Wt 74.8 kg   SpO2 93%   BMI 23.68 kg/m  Physical Exam Vitals signs and nursing note reviewed.  Constitutional:      General: He is not in acute distress.    Appearance: Normal appearance. He is well-developed.  HENT:     Head: Normocephalic and atraumatic.     Right Ear: Hearing normal.     Left Ear: Hearing normal.     Nose: Nose normal.  Eyes:     Conjunctiva/sclera: Conjunctivae normal.     Pupils: Pupils are equal, round, and reactive to light.  Neck:     Musculoskeletal: Normal range of motion and neck supple.  Cardiovascular:     Rate and Rhythm: Regular rhythm.     Heart sounds: S1 normal and S2 normal. No murmur. No friction rub. No gallop.   Pulmonary:     Effort: Pulmonary effort is normal. No respiratory  distress.     Breath sounds: Normal breath sounds.  Chest:     Chest wall: No tenderness.  Abdominal:     General: Bowel sounds are normal.     Palpations: Abdomen is soft.     Tenderness: There is no abdominal tenderness. There is no guarding or rebound. Negative signs include Murphy's sign and McBurney's sign.     Hernia: No hernia is present.  Musculoskeletal: Normal range of motion.  Skin:    General: Skin is warm and dry.     Findings: No rash.  Neurological:     Mental Status: He is alert and oriented to person, place, and time.     GCS: GCS eye subscore is 4. GCS verbal subscore is 5. GCS motor subscore is 6.     Cranial Nerves: No cranial nerve deficit.     Sensory: No sensory deficit.     Coordination: Coordination normal.  Psychiatric:        Mood and Affect: Mood is depressed.        Speech: Speech is slurred.        Behavior: Behavior normal.        Thought Content: Thought content normal.      ED Treatments / Results  Labs (all labs ordered are listed, but only abnormal results are displayed) Labs Reviewed - No data to display  EKG None  Radiology No results found.  Procedures Procedures (including critical care time)  Medications Ordered in ED Medications - No data to display   Initial Impression / Assessment and Plan / ED Course  I have reviewed the triage vital signs and the nursing notes.  Pertinent labs & imaging results that were available during my care of the patient were reviewed by me and considered in my medical decision making (see chart for details).        Patient comes to the ER by ambulance.  He has been drinking alcohol today and did use Xanax.  Xanax use was not an intentional overdose or attempt to commit suicide.  He does, however, endorse intermittent thoughts of wanting to harm himself.  Final Clinical Impressions(s) / ED Diagnoses   Final diagnoses:  Alcoholic intoxication without complication [facility])  Suicidal ideation     ED Discharge Orders    None       , Canary Brim, MD 12/19/18 0121
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
## 2018-12-19 NOTE — ED Notes (Signed)
Pt began yelling out into the hallway saying he needs to talk to someone. This nurse spoke with patient, no specific concerns were made but spoke about his childhood, where he is living, and his displeasure with staff telling him he could not smoke a cigarette. Speaking with patient seems to deescalate him at this time. Will continue to monitor.

## 2018-12-19 NOTE — Discharge Instructions (Signed)
To help you maintain a sober lifestyle, a substance abuse treatment program may be beneficial to you.  Contact one of the following facilities at your earliest opportunity to ask about enrolling:  RESIDENTIAL PROGRAMS:       [facility]      [address] [place], [place] [address]      [phone]       [facility]      [address] [place], [place] [address]      [phone]       [facility]      [address]      [place], [place] [address]      [phone]  OUTPATIENT PROGRAMS:  To help you maintain a sober lifestyle, a substance abuse treatment program may be beneficial to you.  Contact [facility] [place] at your earliest opportunity to schedule an intake appointment;       [facility]      [address]      [place], [place] [address]      [phone]

## 2018-12-19 NOTE — ED Notes (Signed)
TTS called, put tele-psych machine in the room for consult.

## 2018-12-19 NOTE — ED Notes (Signed)
Bed: TM54 Expected date:  Expected time:  Means of arrival:  Comments: EMS alcohol and xanax

## 2019-01-10 ENCOUNTER — Other Ambulatory Visit: Payer: Self-pay

## 2019-01-10 ENCOUNTER — Emergency Department (HOSPITAL_COMMUNITY)
Admission: EM | Admit: 2019-01-10 | Discharge: 2019-01-10 | Disposition: A | Discharge status: Home / Self Care | Payer: Self-pay | Attending: Emergency Medicine | Admitting: Emergency Medicine

## 2019-01-10 DIAGNOSIS — F17210 Nicotine dependence, cigarettes, uncomplicated: Secondary | ICD-10-CM | POA: Insufficient documentation

## 2019-01-10 DIAGNOSIS — K0889 Other specified disorders of teeth and supporting structures: Secondary | ICD-10-CM | POA: Insufficient documentation

## 2019-01-10 DIAGNOSIS — Z79899 Other long term (current) drug therapy: Secondary | ICD-10-CM | POA: Insufficient documentation

## 2019-01-10 DIAGNOSIS — K029 Dental caries, unspecified: Secondary | ICD-10-CM

## 2019-01-10 MED ORDER — CLINDAMYCIN HCL 300 MG PO CAPS: 300.0000 mg | ORAL_CAPSULE | Freq: Three times a day (TID) | ORAL | 0 refills | Status: AC

## 2019-01-10 MED ORDER — HYDROCODONE-ACETAMINOPHEN 5-325 MG PO TABS: 1.0000 | ORAL_TABLET | Freq: Four times a day (QID) | ORAL | 0 refills | Status: DC | PRN

## 2019-01-10 NOTE — ED Notes (Signed)
Patient verbalizes understanding of discharge instructions. Opportunity for questioning and answering were provided.  patient discharged from ED.  

## 2019-01-10 NOTE — ED Triage Notes (Signed)
Pt c/o right lower tooth pain x 3 weeks and states that the pain got worse this morning ; pt states he has been taking ibuprofen but hasn't helped relieve his pain ;

## 2019-01-10 NOTE — ED Provider Notes (Signed)
[facility]   CSN: [id number] Arrival date & time: 01/10/19  0645  History   Chief Complaint Chief Complaint  Patient presents with  . Dental Pain   HPI Nathaniel KaufmannFrankie Lewis Lewis is a 53 y.o. male with past medical history significant for ETOH abuse, anxiety, SDH, depression who presents for evaluation of dental pain. Pain onset 3 weeks ago however acutely worsened over the last 4 days. Pain located to the right back lower molars. He is not followed by dentistry. No jaw pain, drooling, trismus. Pain worse with hot and cold liquids.  States he did see dentistry approximally 1 year ago and they told him he needed to have multiple teeth removed due to dental caries and gum erosion.  Patient states he cannot afford this at the time.  Denies fever, chills, nausea, vomiting, headache, nasal congestion, rhinorrhea, drooling, dysphagia, trismus. Has taken ibuprofen yesterday with mild relief of symptoms. Pain currently a 7/10. Denies radiation of pain.  History obtained from patient. No interpretor was used.     HPI  Past Medical History:  Diagnosis Date  . Anxiety   . ETOH abuse     Patient Active Problem List   Diagnosis Date Noted  . Alcohol use disorder, severe, dependence (HCC) 12/19/2018  . SDH (subdural hematoma) (HCC) 09/28/2018  . Alcohol abuse 12/19/2011  . Depression 12/19/2011    No past surgical history on file.    Home Medications    Prior to Admission medications   Medication Sig Start Date End Date Taking? Authorizing Provider  ALPRAZolam Prudy Feeler(XANAX) 0.5 MG tablet Take 0.5 mg by mouth 5 (five) times daily.    [provider]  clindamycin (CLEOCIN) 300 MG capsule Take 1 capsule (300 mg total) by mouth 3 (three) times daily for 4 days. 01/10/19 01/14/19  Sherell Christoffel A, PA-C  HYDROcodone-acetaminophen (NORCO/VICODIN) 5-325 MG tablet Take 1 tablet by mouth every 6 (six) hours as needed for severe pain. 01/10/19    Jezreel Justiniano A, PA-C  VENTOLIN HFA 108 (90 Base) MCG/ACT inhaler Inhale 1-2 puffs into the lungs 4 (four) times daily as needed for wheezing or shortness of breath.  01/03/18   [provider]    Family History No family history on file.  Social History Social History   Tobacco Use  . Smoking status: Current Every Day Smoker  . Smokeless tobacco: Never Used  Substance Use Topics  . Alcohol use: Yes  . Drug use: No     Allergies   Penicillins   Review of Systems Review of Systems  Constitutional: Negative.   HENT: Positive for dental problem. Negative for congestion, drooling, ear discharge, ear pain, facial swelling, hearing loss, mouth sores, nosebleeds, postnasal drip, rhinorrhea, sinus pressure, sinus pain, sneezing, sore throat, tinnitus, trouble swallowing and voice change.   Respiratory: Negative.   Cardiovascular: Negative.   Gastrointestinal: Negative.   Genitourinary: Negative.   Musculoskeletal: Negative.   Skin: Negative.   Neurological: Negative.   All other systems reviewed and are negative.   Physical Exam Updated Vital Signs BP (!) 136/92   Pulse 64   Temp 98.2 F (36.8 C) (Oral)   Resp 18   Ht 5\' 10"  (1.778 m)   Wt 74 kg   SpO2 100%   BMI 23.41 kg/m   Physical Exam Vitals signs and nursing note reviewed.  Constitutional:      General: He is not in acute distress.    Appearance: He is not ill-appearing,  toxic-appearing or diaphoretic.  HENT:     Head: Normocephalic and atraumatic.     Jaw: There is normal jaw occlusion.     Right Ear: Tympanic membrane, ear canal and external ear normal. There is no impacted cerumen. No hemotympanum. Tympanic membrane is not injected, scarred, perforated, erythematous, retracted or bulging.     Left Ear: Tympanic membrane, ear canal and external ear normal. There is no impacted cerumen. No hemotympanum. Tympanic membrane is not injected, scarred, perforated, erythematous, retracted or bulging.      Ears:     Comments: No Mastoid tenderness.    Nose:     Comments: Clear rhinorrhea and congestion to bilateral nares.  No sinus tenderness.    Mouth/Throat:     Lips: Pink.     Mouth: Mucous membranes are moist.     Dentition: Abnormal dentition. Does not have dentures. Dental tenderness and dental caries present. No gingival swelling, dental abscesses or gum lesions.     Pharynx: Oropharynx is clear. Uvula midline.     Tonsils: No tonsillar exudate or tonsillar abscesses. 0 on the right. 0 on the left.      Comments: Posterior oropharynx clear.  Mucous membranes moist.  Tonsils without erythema or exudate.  Uvula midline without deviation.  No evidence of PTA or RPA.  No drooling, dysphasia or trismus.  Phonation normal. Multiple missing teeth with dental caries and teeth erroded to gum line. Receeding gum line to all teeth with exposed root to right lower back tooth # 30. Mild surround gingival erythema. No periapical abscess, No pooling secretions. Sublingual area soft. No submandibular swelling. No ludwigs angina. Neck:     Musculoskeletal: Full passive range of motion without pain.     Trachea: Trachea and phonation normal.     Comments: No Neck stiffness or neck rigidity.  No meningismus.  No cervical lymphadenopathy. Cardiovascular:     Comments: No murmurs rubs or gallops. Pulmonary:     Comments: Clear to auscultation bilaterally without wheeze, rhonchi or rales.  No accessory muscle usage.  Able speak in full sentences. Abdominal:     Comments: Soft, nontender without rebound or guarding.  No CVA tenderness.  Musculoskeletal:     Comments: Moves all 4 extremities without difficulty.  Lower extremities without edema, erythema or warmth.  Skin:    Comments: Brisk capillary refill.  No rashes or lesions.  Neurological:     Mental Status: He is alert.     Comments: Ambulatory in department without difficulty.  Cranial nerves II through XII grossly intact.  No facial droop.  No  aphasia.    ED Treatments / Results  Labs (all labs ordered are listed, but only abnormal results are displayed) Labs Reviewed - No data to display  EKG None  Radiology No results found.  Procedures Procedures (including critical care time)  Medications Ordered in ED Medications - No data to display   Initial Impression / Assessment and Plan / ED Course  I have reviewed the triage vital signs and the nursing notes.  Pertinent labs & imaging results that were available during my care of the patient were reviewed by me and considered in my medical decision making (see chart for details).  53 year old who presents for evaluation of dental pain. Afebrile, non septic non ill appearing. Pain x 3 weeks however worsened over the last 3 days. Pain located to right lower dentition. Posterior oropharynx clear.  Mucous membranes moist.  Tonsils without erythema or exudate.  Uvula midline without deviation.  No evidence of PTA or RPA.  No drooling, dysphasia or trismus.  Phonation normal. Multiple missing teeth with dental caries and teeth erroded to gum line. Receeding gum line to all teeth with exposed root to right lower back tooth # 30. Mild surround gingival erythema. No periapical abscess, No pooling secretions. Sublingual area soft. No submandibular swelling. No neck stiffness or neck rigidity. No facial swelling. No gross abscess.  Exam unconcerning for Ludwig's angina or spread of infection. No facial asymmetry , HA. Normal neuro exam without deficits. Will treat with Clindamycin as patient with allergy to penicillins and anti-inflammatories medicine. Urged patient to follow-up with dentist.    Patient is hemodynamically stable and in no acute distress.  Patient able to ambulate in department prior to ED.  Evaluation does not show acute pathology that would require ongoing or additional emergent interventions while in the emergency department or further inpatient treatment.  I have discussed  the diagnosis with the patient and answered all questions.  Pain is been managed while in the emergency department and patient has no further complaints prior to discharge.  Patient is comfortable with plan discussed in room and is stable for discharge at this time.  I have discussed strict return precautions for returning to the emergency department.  Patient was encouraged to follow-up with PCP/specialist refer to at discharge.     Final Clinical Impressions(s) / ED Diagnoses   Final diagnoses:  Pain due to dental caries    ED Discharge Orders         Ordered    HYDROcodone-acetaminophen (NORCO/VICODIN) 5-325 MG tablet  Every 6 hours PRN     01/10/19 0733    clindamycin (CLEOCIN) 300 MG capsule  3 times daily     01/10/19 0735           Kwynn Schlotter A, PA-C 01/10/19 3968    Raeford Razor, MD 01/10/19 [phone]

## 2019-01-10 NOTE — Discharge Instructions (Signed)
Evaluated today for dental pain. This is likely due to dental caries. You will need to follow up with dentistry. I have provided you dental resources in your discharge paperwork. I would suggest calling to schedule and appointment as soon as possible.

## 2019-01-26 ENCOUNTER — Emergency Department (HOSPITAL_COMMUNITY)
Admission: EM | Admit: 2019-01-26 | Discharge: 2019-01-26 | Disposition: A | Discharge status: Home / Self Care | Payer: Self-pay | Attending: Emergency Medicine | Admitting: Emergency Medicine

## 2019-01-26 DIAGNOSIS — K0889 Other specified disorders of teeth and supporting structures: Secondary | ICD-10-CM | POA: Insufficient documentation

## 2019-01-26 DIAGNOSIS — Z5321 Procedure and treatment not carried out due to patient leaving prior to being seen by health care provider: Secondary | ICD-10-CM | POA: Insufficient documentation

## 2019-01-26 NOTE — ED Notes (Signed)
Pt advised he was going to come back in the morning. Pt was tired of waiting.

## 2019-01-26 NOTE — ED Triage Notes (Signed)
See paper chart 

## 2019-02-01 DIAGNOSIS — F1310 Sedative, hypnotic or anxiolytic abuse, uncomplicated: Secondary | ICD-10-CM | POA: Insufficient documentation

## 2019-07-17 DIAGNOSIS — B182 Chronic viral hepatitis C: Secondary | ICD-10-CM | POA: Insufficient documentation

## 2019-11-07 ENCOUNTER — Encounter (HOSPITAL_COMMUNITY): Payer: Self-pay | Admitting: Emergency Medicine

## 2019-11-07 ENCOUNTER — Emergency Department (HOSPITAL_COMMUNITY)
Admission: EM | Admit: 2019-11-07 | Discharge: 2019-11-07 | Disposition: A | Discharge status: Home / Self Care | Payer: Self-pay | Attending: Emergency Medicine | Admitting: Emergency Medicine

## 2019-11-07 ENCOUNTER — Other Ambulatory Visit: Payer: Self-pay

## 2019-11-07 DIAGNOSIS — F17210 Nicotine dependence, cigarettes, uncomplicated: Secondary | ICD-10-CM | POA: Insufficient documentation

## 2019-11-07 DIAGNOSIS — R040 Epistaxis: Secondary | ICD-10-CM | POA: Insufficient documentation

## 2019-11-07 DIAGNOSIS — Z79899 Other long term (current) drug therapy: Secondary | ICD-10-CM | POA: Insufficient documentation

## 2019-11-07 LAB — CBC WITH DIFFERENTIAL/PLATELET
Abs Immature Granulocytes: 0.01 10*3/uL (ref 0.00–0.07)
Basophils Absolute: 0.1 10*3/uL (ref 0.0–0.1)
Basophils Relative: 1 %
Eosinophils Absolute: 0.4 10*3/uL (ref 0.0–0.5)
Eosinophils Relative: 7 %
HCT: 39.2 % (ref 39.0–52.0)
Hemoglobin: 13.3 g/dL (ref 13.0–17.0)
Immature Granulocytes: 0 %
Lymphocytes Relative: 40 %
Lymphs Abs: 2.4 10*3/uL (ref 0.7–4.0)
MCH: 32.4 pg (ref 26.0–34.0)
MCHC: 33.9 g/dL (ref 30.0–36.0)
MCV: 95.6 fL (ref 80.0–100.0)
Monocytes Absolute: 0.4 10*3/uL (ref 0.1–1.0)
Monocytes Relative: 7 %
Neutro Abs: 2.7 10*3/uL (ref 1.7–7.7)
Neutrophils Relative %: 45 %
Platelets: 190 10*3/uL (ref 150–400)
RBC: 4.1 MIL/uL — ABNORMAL LOW (ref 4.22–5.81)
RDW: 12 % (ref 11.5–15.5)
WBC: 5.9 10*3/uL (ref 4.0–10.5)
nRBC: 0 % (ref 0.0–0.2)

## 2019-11-07 LAB — BASIC METABOLIC PANEL
Anion gap: 7 (ref 5–15)
BUN: 14 mg/dL (ref 6–20)
CO2: 27 mmol/L (ref 22–32)
Calcium: 8.9 mg/dL (ref 8.9–10.3)
Chloride: 109 mmol/L (ref 98–111)
Creatinine, Ser: 1.15 mg/dL (ref 0.61–1.24)
GFR calc Af Amer: >60 mL/min (ref >60)
GFR calc non Af Amer: >60 mL/min (ref >60)
Glucose, Bld: 101 mg/dL — ABNORMAL HIGH (ref 70–99)
Potassium: 3.9 mmol/L (ref 3.5–5.1)
Sodium: 143 mmol/L (ref 135–145)

## 2019-11-07 MED ORDER — OXYMETAZOLINE HCL 0.05 % NA SOLN
2.0000 | Freq: Once | NASAL | Status: AC
  Administered 2019-11-07: 2 via NASAL
  Filled 2019-11-07: qty 30

## 2019-11-07 NOTE — Discharge Instructions (Signed)
If the nose starts bleeding, use two sprays of afrin and apply pressure to the nose for 12-20 minutes. If the nose continues to bleed despite this then return to the ED.   You were given a referral to an ear nose and throat doctor. Please call the office to schedule and appointment for follow up.   Please return to the emergency department for any new or worsening symptoms.

## 2019-11-07 NOTE — ED Provider Notes (Signed)
[place] EMERGENCY DEPARTMENT Provider Note   CSN: [id number] Arrival date & time: 11/07/19  4970     History Chief Complaint  Patient presents with  . Epistaxis    Nathaniel Lewis is a 54 y.o. male.  HPI   55 year old male with a history of anxiety, EtOH abuse, who presents to the emergency department today for evaluation of epistaxis.  States that for the last week he has had intermittent right-sided epistaxis.  States he started bleeding last night and was bleeding all night.  States that it was "gushing ".  Since arriving to the ED his symptoms have subsided and he is not currently bleeding anymore.  States he followed up with his PCP last week for evaluation of this and was told to use saline spray in the nose.  He denies any other symptoms at this time.  He is not anticoagulated.  Past Medical History:  Diagnosis Date  . Anxiety   . ETOH abuse     Patient Active Problem List   Diagnosis Date Noted  . Alcohol use disorder, severe, dependence ([place]) 12/19/2018  . SDH (subdural hematoma) ([place]) 09/28/2018  . Alcohol abuse 12/19/2011  . Depression 12/19/2011    History reviewed. No pertinent surgical history.     No family history on file.  Social History   Tobacco Use  . Smoking status: Current Every Day Smoker  . Smokeless tobacco: Never Used  Substance Use Topics  . Alcohol use: Yes  . Drug use: No    Home Medications Prior to Admission medications   Medication Sig Start Date End Date Taking? Authorizing Provider  ALPRAZolam Duanne Moron) 0.5 MG tablet Take 0.5 mg by mouth 5 (five) times daily.    [provider]  HYDROcodone-acetaminophen (NORCO/VICODIN) 5-325 MG tablet Take 1 tablet by mouth every 6 (six) hours as needed for severe pain. 01/10/19   Henderly, Britni A, PA-C  VENTOLIN HFA 108 (90 Base) MCG/ACT inhaler Inhale 1-2 puffs into the lungs 4 (four) times daily as needed for wheezing or shortness of breath.  01/03/18    [provider]    Allergies    Penicillins  Review of Systems   Review of Systems  Constitutional: Negative for fever.  HENT: Positive for nosebleeds.   Neurological: Negative for headaches.    Physical Exam Updated Vital Signs BP 117/79 (BP Location: Right Arm)   Pulse 71   Temp 98.3 F (36.8 C)   Resp 14   Ht 5\' 10"  (1.778 m)   Wt 85 kg   SpO2 98%   BMI 26.89 kg/m   Physical Exam Constitutional:      General: He is not in acute distress.    Appearance: He is well-developed.  HENT:     Nose: No congestion or rhinorrhea.     Comments: Scant amount of dried blood in the bilat nares. No active bleeding.  Eyes:     Conjunctiva/sclera: Conjunctivae normal.  Cardiovascular:     Rate and Rhythm: Normal rate.  Pulmonary:     Effort: Pulmonary effort is normal.  Skin:    General: Skin is warm and dry.  Neurological:     Mental Status: He is alert and oriented to person, place, and time.     ED Results / Procedures / Treatments   Labs (all labs ordered are listed, but only abnormal results are displayed) Labs Reviewed  CBC WITH DIFFERENTIAL/PLATELET - Abnormal; Notable for the following components:  Result Value   RBC 4.10 (*)    All other components within normal limits  BASIC METABOLIC PANEL - Abnormal; Notable for the following components:   Glucose, Bld 101 (*)    All other components within normal limits    EKG None  Radiology No results found.  Procedures Procedures (including critical care time)  Medications Ordered in ED Medications  oxymetazoline (AFRIN) 0.05 % nasal spray 2 spray (2 sprays Each Nare Given 11/07/19 0240)    ED Course  I have reviewed the triage vital signs and the nursing notes.  Pertinent labs & imaging results that were available during my care of the patient were reviewed by me and considered in my medical decision making (see chart for details).    MDM Rules/Calculators/A&P                      Pt  presenting with epistaxis intermittently for the last week. Had prolonged epistaxis last night but on my eval pt has stopped bleeding. Afrin given in the ED And pressure applied to the nose. On reassessment, hemostasis has been achieved.   Pt expressed frustration at having to wait in the ED for so many hours only to receive Afrin. I explained that Afrin is first line therapy for nosebleeds and that further intervention such as nasal packing/cautery is not indicated if the nose has stopped bleeding after administration of this medication. I discussed the results of the labs which were reviewed/interpreted by myself and were reassuring. There was no evidence of anemia, thrombocytopenia or other gross abnormality.   Discussed that given repeated episodes of epistaxis, I would refer to ENT. Advised on supportive therapy at home and will d/c with afrin. Discussed medication precautions. Advised on return precautions. He voiced understanding of the plan and reasons to return. All questions answered, pt stable for d/c.   Pt eloped prior to receiving his discharged paperwork.   Final Clinical Impression(s) / ED Diagnoses Final diagnoses:  Epistaxis    Rx / DC Orders ED Discharge Orders    None       Karrie Meres, PA-C 11/07/19 9735    Maia Plan, MD 11/13/19 [phone]

## 2019-11-07 NOTE — ED Triage Notes (Signed)
Patient reports right epistaxis this evening , denies nasal injury , respirations unlabored , no fever or cough .

## 2020-08-11 ENCOUNTER — Emergency Department (HOSPITAL_COMMUNITY): Payer: Self-pay

## 2020-08-11 ENCOUNTER — Other Ambulatory Visit: Payer: Self-pay

## 2020-08-11 ENCOUNTER — Emergency Department (HOSPITAL_COMMUNITY)
Admission: EM | Admit: 2020-08-11 | Discharge: 2020-08-11 | Disposition: A | Discharge status: Home / Self Care | Payer: Self-pay | Attending: Emergency Medicine | Admitting: Emergency Medicine

## 2020-08-11 ENCOUNTER — Encounter (HOSPITAL_COMMUNITY): Payer: Self-pay

## 2020-08-11 DIAGNOSIS — R4182 Altered mental status, unspecified: Secondary | ICD-10-CM | POA: Insufficient documentation

## 2020-08-11 DIAGNOSIS — F17200 Nicotine dependence, unspecified, uncomplicated: Secondary | ICD-10-CM | POA: Insufficient documentation

## 2020-08-11 LAB — DIFFERENTIAL
Abs Immature Granulocytes: 0.02 10*3/uL (ref 0.00–0.07)
Basophils Absolute: 0.1 10*3/uL (ref 0.0–0.1)
Basophils Relative: 1 %
Eosinophils Absolute: 0.2 10*3/uL (ref 0.0–0.5)
Eosinophils Relative: 3 %
Immature Granulocytes: 0 %
Lymphocytes Relative: 33 %
Lymphs Abs: 2.8 10*3/uL (ref 0.7–4.0)
Monocytes Absolute: 0.5 10*3/uL (ref 0.1–1.0)
Monocytes Relative: 6 %
Neutro Abs: 4.8 10*3/uL (ref 1.7–7.7)
Neutrophils Relative %: 57 %

## 2020-08-11 LAB — URINALYSIS, ROUTINE W REFLEX MICROSCOPIC
Bilirubin Urine: NEGATIVE
Glucose, UA: NEGATIVE mg/dL
Hgb urine dipstick: NEGATIVE
Ketones, ur: NEGATIVE mg/dL
Leukocytes,Ua: NEGATIVE
Nitrite: NEGATIVE
Protein, ur: NEGATIVE mg/dL
Specific Gravity, Urine: 1.025 (ref 1.005–1.030)
pH: 5 (ref 5.0–8.0)

## 2020-08-11 LAB — COMPREHENSIVE METABOLIC PANEL
ALT: 27 U/L (ref 0–44)
AST: 36 U/L (ref 15–41)
Albumin: 3.9 g/dL (ref 3.5–5.0)
Alkaline Phosphatase: 59 U/L (ref 38–126)
Anion gap: 12 (ref 5–15)
BUN: 18 mg/dL (ref 6–20)
CO2: 24 mmol/L (ref 22–32)
Calcium: 8.9 mg/dL (ref 8.9–10.3)
Chloride: 109 mmol/L (ref 98–111)
Creatinine, Ser: 1.02 mg/dL (ref 0.61–1.24)
GFR, Estimated: >60 mL/min (ref >60)
Glucose, Bld: 97 mg/dL (ref 70–99)
Potassium: 4.1 mmol/L (ref 3.5–5.1)
Sodium: 145 mmol/L (ref 135–145)
Total Bilirubin: 0.8 mg/dL (ref 0.3–1.2)
Total Protein: 7.6 g/dL (ref 6.5–8.1)

## 2020-08-11 LAB — CBC
HCT: 41.4 % (ref 39.0–52.0)
Hemoglobin: 14.3 g/dL (ref 13.0–17.0)
MCH: 31.1 pg (ref 26.0–34.0)
MCHC: 34.5 g/dL (ref 30.0–36.0)
MCV: 90 fL (ref 80.0–100.0)
Platelets: 259 10*3/uL (ref 150–400)
RBC: 4.6 MIL/uL (ref 4.22–5.81)
RDW: 11.9 % (ref 11.5–15.5)
WBC: 8.5 10*3/uL (ref 4.0–10.5)
nRBC: 0 % (ref 0.0–0.2)

## 2020-08-11 LAB — RAPID URINE DRUG SCREEN, HOSP PERFORMED
Amphetamines: NOT DETECTED
Barbiturates: NOT DETECTED
Benzodiazepines: POSITIVE — AB
Cocaine: NOT DETECTED
Opiates: NOT DETECTED
Tetrahydrocannabinol: POSITIVE — AB

## 2020-08-11 LAB — SALICYLATE LEVEL: Salicylate Lvl: <7 mg/dL — ABNORMAL LOW (ref 7.0–30.0)

## 2020-08-11 LAB — ETHANOL: Alcohol, Ethyl (B): <10 mg/dL (ref <10)

## 2020-08-11 LAB — ACETAMINOPHEN LEVEL: Acetaminophen (Tylenol), Serum: <10 ug/mL — ABNORMAL LOW (ref 10–30)

## 2020-08-11 NOTE — ED Notes (Signed)
Patient uncooperative with blood draw. Writer able to obtain small amount of blood for processing. Samples sent to lab. Patient refuses to provide urine

## 2020-08-11 NOTE — ED Triage Notes (Signed)
Pt from halfway house [phone] [facility] [address]). Pt has no complaint but residents say he is not acting right. Pt took extra tramadol but unsure how many. Protocol call [employer] then EMS notified for transport.

## 2020-08-11 NOTE — ED Notes (Signed)
Patient states he took 3 tramadol this morning and has no desire to take anymore. States he may never take another tramadol. Patient denies SI or HI. Denies visual or audible hallucinations.

## 2020-08-11 NOTE — ED Notes (Signed)
Patient continually walking hallways, refuses to sit down.

## 2020-08-11 NOTE — Discharge Instructions (Signed)
Please return to the ER for any new or worsening symptoms

## 2020-08-11 NOTE — ED Provider Notes (Signed)
[facility]-EMERGENCY DEPT Provider Note   CSN: [id number] Arrival date & time: 08/11/20  1302     History Chief Complaint  Patient presents with  . Altered Mental Status    Nathaniel Lewis is a 54 y.o. male.  HPI Level 5 caveat secondary to altered mental status 54 year old male with a history of anxiety, EtOH abuse, depression who presents to the ER from his halfway house with lines of altered mental status.  Patient does not use any present complaints, states that he has a history of chronic pain and takes tramadol.  He states he took 1 extra 1 today.  Per chart review, patient does have a history of opioid abuse.  He states he had 3 "tall" beers last night".  Patient is visibly intoxicated, slurring his words, stumbling but redirectable.  He denies any SI/HI.  No visual or auditory hallucinations.  Denies any fevers or chills.  Abdominal pain.  Denies any falls or head trauma.  Patient is alert to person and place, however thinks it is 2022.    Past Medical History:  Diagnosis Date  . Anxiety   . ETOH abuse     Patient Active Problem List   Diagnosis Date Noted  . Alcohol use disorder, severe, dependence (HCC) 12/19/2018  . SDH (subdural hematoma) (HCC) 09/28/2018  . Alcohol abuse 12/19/2011  . Depression 12/19/2011    History reviewed. No pertinent surgical history.     History reviewed. No pertinent family history.  Social History   Tobacco Use  . Smoking status: Current Every Day Smoker  . Smokeless tobacco: Never Used  Substance Use Topics  . Alcohol use: Yes  . Drug use: No    Home Medications Prior to Admission medications   Medication Sig Start Date End Date Taking? Authorizing Provider  ALPRAZolam Prudy Feeler) 0.5 MG tablet Take 0.5 mg by mouth 5 (five) times daily.    [provider]  HYDROcodone-acetaminophen (NORCO/VICODIN) 5-325 MG tablet Take 1 tablet by mouth every 6 (six) hours as needed for severe pain. 01/10/19    Henderly, Britni A, PA-C  VENTOLIN HFA 108 (90 Base) MCG/ACT inhaler Inhale 1-2 puffs into the lungs 4 (four) times daily as needed for wheezing or shortness of breath.  01/03/18   [provider]    Allergies    Penicillins  Review of Systems   Review of Systems  Constitutional: Negative for chills and fever.  HENT: Negative for ear pain and sore throat.   Eyes: Negative for pain and visual disturbance.  Respiratory: Negative for cough and shortness of breath.   Cardiovascular: Negative for chest pain and palpitations.  Gastrointestinal: Negative for abdominal pain and vomiting.  Genitourinary: Negative for dysuria and hematuria.  Musculoskeletal: Negative for arthralgias and back pain.  Skin: Negative for color change and rash.  Neurological: Negative for seizures and syncope.  All other systems reviewed and are negative.   Physical Exam Updated Vital Signs BP (!) 145/88 (BP Location: Left Arm)   Pulse 79   Temp (!) 97.5 F (36.4 C) (Oral)   Resp 17   SpO2 98%   Physical Exam Vitals reviewed.  Constitutional:      General: He is not in acute distress.    Appearance: Normal appearance. He is not ill-appearing, toxic-appearing or diaphoretic.  HENT:     Head: Normocephalic and atraumatic.  Eyes:     General:        Right eye: No discharge.  Left eye: No discharge.     Extraocular Movements: Extraocular movements intact.     Conjunctiva/sclera: Conjunctivae normal.  Cardiovascular:     Pulses: Normal pulses.     Heart sounds: Normal heart sounds.  Pulmonary:     Effort: Pulmonary effort is normal.     Breath sounds: Normal breath sounds.  Abdominal:     General: Abdomen is flat.     Tenderness: There is no abdominal tenderness.  Musculoskeletal:        General: No swelling. Normal range of motion.  Skin:    General: Skin is warm and dry.     Capillary Refill: Capillary refill takes less than 2 seconds.  Neurological:     General: No focal  deficit present.     Mental Status: He is alert and oriented to person, place, and time.     Sensory: No sensory deficit.     Motor: No weakness.     Comments: Mental Status:  Alert, thought content appropriate, able to give a coherent history. Speech fluent without evidence of aphasia. Able to follow 2 step commands without difficulty.  Cranial Nerves:  II: Peripheral visual fields grossly normal, pupils equal, round, reactive to light III,IV, VI: ptosis not present, extra-ocular motions intact bilaterally  V,VII: smile symmetric, facial light touch sensation equal VIII: hearing grossly normal to voice  X: uvula elevates symmetrically  XI: bilateral shoulder shrug symmetric and strong XII: midline tongue extension without fassiculations Motor:  Normal tone. 5/5 strength of BUE and BLE major muscle groups including strong and equal grip strength and dorsiflexion/plantar flexion Sensory: light touch normal in all extremities. Cerebellar: normal finger-to-nose with bilateral upper extremities, Romberg sign absent Gait: slightly wobbly gait. Able to walk on toes and heels with ease.    Psychiatric:        Mood and Affect: Mood normal.        Behavior: Behavior normal.     ED Results / Procedures / Treatments   Labs (all labs ordered are listed, but only abnormal results are displayed) Labs Reviewed  RAPID URINE DRUG SCREEN, HOSP PERFORMED - Abnormal; Notable for the following components:      Result Value   Benzodiazepines POSITIVE (*)    Tetrahydrocannabinol POSITIVE (*)    All other components within normal limits  SALICYLATE LEVEL - Abnormal; Notable for the following components:   Salicylate Lvl <7.0 (*)    All other components within normal limits  ACETAMINOPHEN LEVEL - Abnormal; Notable for the following components:   Acetaminophen (Tylenol), Serum <10 (*)    All other components within normal limits  COMPREHENSIVE METABOLIC PANEL  ETHANOL  URINALYSIS, ROUTINE W REFLEX  MICROSCOPIC  CBC  DIFFERENTIAL  CBC WITH DIFFERENTIAL/PLATELET    EKG None  Radiology No results found.  Procedures Procedures (including critical care time)  Medications Ordered in ED Medications - No data to display  ED Course  I have reviewed the triage vital signs and the nursing notes.  Pertinent labs & imaging results that were available during my care of the patient were reviewed by me and considered in my medical decision making (see chart for details).    MDM Rules/Calculators/A&P                         54 year old male who presents from his halfway house with reported altered mental status.  History of alcohol and polysubstance abuse.  On arrival, patient is alert and oriented x2,  thinks it is 2022, however on reevaluation later, reports 2021.  He is denying SI/HI, auditory visual hallucinations.  States he had taken 3 tramadol last night as well as 3 beers yesterday.  CBC no leukocytosis, UA without evidence of UTI.  Ethanol is negative.  CMP unremarkable.  UDS with positive benzodiazepines and THC.  Negative Tylenol and salicylate levels.  CT head without any abnormalities.  Patient is requesting to leave AGAINST MEDICAL ADVICE.  There is no signs of infection, low suspicion for stroke as he does not have any acute neuro findings and a CT scan is normal.  Suspect element of polysubstance use, however the patient is alert and oriented x3, and is able to make decisions for himself.  Encourage the patient to stop using drugs, stable for discharge back to his facility with turn precautions.  He voiced understanding and is agreeable.  Left ED with normal steady gait.  Discussed the case with Dr. Fredderick Phenix who is agreeable to the plan and disposition.  Final Clinical Impression(s) / ED Diagnoses Final diagnoses:  Altered mental status, unspecified altered mental status type    Rx / DC Orders ED Discharge Orders    None       Leone Brand 08/22/20 2331     Rolan Bucco, MD 08/22/20 2348

## 2020-09-10 ENCOUNTER — Encounter (HOSPITAL_COMMUNITY): Payer: Self-pay

## 2020-09-10 ENCOUNTER — Other Ambulatory Visit: Payer: Self-pay

## 2020-09-10 DIAGNOSIS — F17200 Nicotine dependence, unspecified, uncomplicated: Secondary | ICD-10-CM | POA: Insufficient documentation

## 2020-09-10 DIAGNOSIS — F1010 Alcohol abuse, uncomplicated: Secondary | ICD-10-CM | POA: Insufficient documentation

## 2020-09-10 NOTE — ED Triage Notes (Signed)
Patient arrived stating he came to the ED for detox from alcohol last drink today.

## 2020-09-11 ENCOUNTER — Emergency Department (HOSPITAL_COMMUNITY)
Admission: EM | Admit: 2020-09-11 | Discharge: 2020-09-11 | Disposition: A | Discharge status: Home / Self Care | Payer: Self-pay | Attending: Emergency Medicine | Admitting: Emergency Medicine

## 2020-09-11 DIAGNOSIS — F1010 Alcohol abuse, uncomplicated: Secondary | ICD-10-CM

## 2020-09-11 MED ORDER — CARBAMAZEPINE 200 MG PO TABS: ORAL_TABLET | ORAL | 0 refills | Status: DC

## 2020-09-11 MED ORDER — LORAZEPAM 1 MG PO TABS
1.0000 mg | ORAL_TABLET | Freq: Once | ORAL | Status: AC
  Administered 2020-09-11: 1 mg via ORAL
  Filled 2020-09-11: qty 1

## 2020-09-11 MED ORDER — IBUPROFEN 800 MG PO TABS
800.0000 mg | ORAL_TABLET | Freq: Once | ORAL | Status: AC
  Administered 2020-09-11: 800 mg via ORAL
  Filled 2020-09-11: qty 1

## 2020-09-11 NOTE — ED Provider Notes (Signed)
[facility]-EMERGENCY DEPT Provider Note   CSN: [id number] Arrival date & time: 09/10/20  2124     History Chief Complaint  Patient presents with  . Alcohol Intoxication    Nathaniel Lewis is a 55 y.o. male.  55 year old male with a history of ETOH abuse, anxiety, SDH, depression presents to the emergency department requesting assistance with relapse.  States that he resumed drinking alcohol a few months ago.  His last drink was yesterday.  He has been admitted at [facility] for alcohol detox in the past.  Would ideally like to be admitted for detox again.  Has experienced tremors as a result of withdrawal.  Denies any prior history of withdrawal seizures.  No SI/HI.  The history is provided by the patient. No language interpreter was used.  Alcohol Intoxication       Past Medical History:  Diagnosis Date  . Anxiety   . ETOH abuse     Patient Active Problem List   Diagnosis Date Noted  . Alcohol use disorder, severe, dependence (HCC) 12/19/2018  . SDH (subdural hematoma) (HCC) 09/28/2018  . Alcohol abuse 12/19/2011  . Depression 12/19/2011    History reviewed. No pertinent surgical history.     No family history on file.  Social History   Tobacco Use  . Smoking status: Current Every Day Smoker  . Smokeless tobacco: Never Used  Substance Use Topics  . Alcohol use: Yes  . Drug use: No    Home Medications Prior to Admission medications   Medication Sig Start Date End Date Taking? Authorizing Provider  carbamazepine (TEGRETOL) 200 MG tablet 800mg  PO QD X 1D, then 600mg  PO QD X 1D, then 400mg  QD X 1D, then 200mg  PO QD X 2D 09/11/20  Yes , PA-C  ALPRAZolam ) 0.5 MG tablet Take 0.5 mg by mouth 5 (five) times daily.    [provider]  HYDROcodone-acetaminophen (NORCO/VICODIN) 5-325 MG tablet Take 1 tablet by mouth every 6 (six) hours as needed for severe pain. 01/10/19   Henderly, Britni A, PA-C  VENTOLIN  HFA 108 (90 Base) MCG/ACT inhaler Inhale 1-2 puffs into the lungs 4 (four) times daily as needed for wheezing or shortness of breath.  01/03/18   [provider]    Allergies    Penicillins  Review of Systems   Review of Systems  Ten systems reviewed and are negative for acute change, except as noted in the HPI.    Physical Exam Updated Vital Signs BP 123/89 (BP Location: Left Arm)   Pulse 70   Temp 98.4 F (36.9 C) (Oral)   Resp 16   SpO2 95%   Physical Exam Vitals and nursing note reviewed.  Constitutional:      General: He is not in acute distress.    Appearance: He is well-developed and well-nourished. He is not diaphoretic.  HENT:     Head: Normocephalic and atraumatic.  Eyes:     General: No scleral icterus.    Extraocular Movements: EOM normal.     Comments: Mild conjunctival injection b/l  Pulmonary:     Effort: Pulmonary effort is normal. No respiratory distress.  Musculoskeletal:        General: Normal range of motion.     Cervical back: Normal range of motion.  Skin:    General: Skin is warm and dry.     Coloration: Skin is not pale.     Findings: No erythema or rash.  Neurological:  Mental Status: He is alert and oriented to person, place, and time.  Psychiatric:        Mood and Affect: Mood and affect normal.        Behavior: Behavior normal.     ED Results / Procedures / Treatments   Labs (all labs ordered are listed, but only abnormal results are displayed) Labs Reviewed - No data to display  EKG None  Radiology No results found.  Procedures Procedures   Medications Ordered in ED Medications  LORazepam (ATIVAN) tablet 1 mg (has no administration in time range)    ED Course  I have reviewed the triage vital signs and the nursing notes.  Pertinent labs & imaging results that were available during my care of the patient were reviewed by me and considered in my medical decision making (see chart for details).    MDM  Rules/Calculators/A&P                          55 year old male presents to the emergency department for evaluation, requesting alcohol detox.  He has been informed that we do not perform inpatient detox at this facility.  His vital signs are presently stable.  He is not tremulous.  His speech is clear.  Last LFTs from 1 month ago were normal.  He will be discharged with a Tegretol taper for outpatient management of detox.  Resource guide provided for local treatment facilities.  Return precautions discussed and provided. Patient discharged in stable condition with no unaddressed concerns.   Final Clinical Impression(s) / ED Diagnoses Final diagnoses:  Alcohol abuse    Rx / DC Orders ED Discharge Orders         Ordered    carbamazepine (TEGRETOL) 200 MG tablet        09/11/20 0406           Antony Madura, PA-C 09/11/20 9024    Dione Booze, MD 09/11/20 [phone]

## 2020-09-11 NOTE — ED Notes (Signed)
Patient ambulatory to stretcher from triage with no difficulty.

## 2020-09-11 NOTE — Discharge Instructions (Signed)
Take tegretol as prescribed for management of alcohol withdrawal symptoms. If you choose to resume drinking, DO NOT TAKE THIS MEDICATION.   You have been provided a resource guide of a number of outpatient facilities that can assist in detox and rehabilitation from alcohol abuse.  You may return to the ED for any new or concerning symptoms.

## 2020-12-01 ENCOUNTER — Ambulatory Visit (HOSPITAL_COMMUNITY)
Admission: EM | Admit: 2020-12-01 | Discharge: 2020-12-02 | Disposition: A | Discharge status: Home / Self Care | Payer: No Payment, Other | Attending: Psychiatry | Admitting: Psychiatry

## 2020-12-01 ENCOUNTER — Emergency Department (HOSPITAL_COMMUNITY)
Admission: EM | Admit: 2020-12-01 | Discharge: 2020-12-01 | Disposition: A | Discharge status: Other Acute Inpatient Hospital | Payer: Self-pay | Attending: Emergency Medicine | Admitting: Emergency Medicine

## 2020-12-01 ENCOUNTER — Other Ambulatory Visit: Payer: Self-pay

## 2020-12-01 DIAGNOSIS — Z20822 Contact with and (suspected) exposure to covid-19: Secondary | ICD-10-CM | POA: Insufficient documentation

## 2020-12-01 DIAGNOSIS — Z79899 Other long term (current) drug therapy: Secondary | ICD-10-CM | POA: Insufficient documentation

## 2020-12-01 DIAGNOSIS — R45851 Suicidal ideations: Secondary | ICD-10-CM | POA: Insufficient documentation

## 2020-12-01 DIAGNOSIS — F1320 Sedative, hypnotic or anxiolytic dependence, uncomplicated: Secondary | ICD-10-CM | POA: Insufficient documentation

## 2020-12-01 DIAGNOSIS — F17200 Nicotine dependence, unspecified, uncomplicated: Secondary | ICD-10-CM | POA: Insufficient documentation

## 2020-12-01 DIAGNOSIS — R4182 Altered mental status, unspecified: Secondary | ICD-10-CM | POA: Insufficient documentation

## 2020-12-01 DIAGNOSIS — F332 Major depressive disorder, recurrent severe without psychotic features: Secondary | ICD-10-CM | POA: Insufficient documentation

## 2020-12-01 DIAGNOSIS — F419 Anxiety disorder, unspecified: Secondary | ICD-10-CM | POA: Insufficient documentation

## 2020-12-01 LAB — CBC WITH DIFFERENTIAL/PLATELET
Abs Immature Granulocytes: 0.02 10*3/uL (ref 0.00–0.07)
Basophils Absolute: 0.1 10*3/uL (ref 0.0–0.1)
Basophils Relative: 1 %
Eosinophils Absolute: 0.2 10*3/uL (ref 0.0–0.5)
Eosinophils Relative: 3 %
HCT: 41 % (ref 39.0–52.0)
Hemoglobin: 14.1 g/dL (ref 13.0–17.0)
Immature Granulocytes: 0 %
Lymphocytes Relative: 22 %
Lymphs Abs: 1.6 10*3/uL (ref 0.7–4.0)
MCH: 29.8 pg (ref 26.0–34.0)
MCHC: 34.4 g/dL (ref 30.0–36.0)
MCV: 86.7 fL (ref 80.0–100.0)
Monocytes Absolute: 0.5 10*3/uL (ref 0.1–1.0)
Monocytes Relative: 6 %
Neutro Abs: 4.9 10*3/uL (ref 1.7–7.7)
Neutrophils Relative %: 68 %
Platelets: 205 10*3/uL (ref 150–400)
RBC: 4.73 MIL/uL (ref 4.22–5.81)
RDW: 12 % (ref 11.5–15.5)
WBC: 7.3 10*3/uL (ref 4.0–10.5)
nRBC: 0 % (ref 0.0–0.2)

## 2020-12-01 LAB — ETHANOL: Alcohol, Ethyl (B): <10 mg/dL (ref <10)

## 2020-12-01 LAB — COMPREHENSIVE METABOLIC PANEL
ALT: 18 U/L (ref 0–44)
AST: 33 U/L (ref 15–41)
Albumin: 3.9 g/dL (ref 3.5–5.0)
Alkaline Phosphatase: 61 U/L (ref 38–126)
Anion gap: 8 (ref 5–15)
BUN: 16 mg/dL (ref 6–20)
CO2: 29 mmol/L (ref 22–32)
Calcium: 9.1 mg/dL (ref 8.9–10.3)
Chloride: 106 mmol/L (ref 98–111)
Creatinine, Ser: 0.95 mg/dL (ref 0.61–1.24)
GFR, Estimated: >60 mL/min (ref >60)
Glucose, Bld: 110 mg/dL — ABNORMAL HIGH (ref 70–99)
Potassium: 4.1 mmol/L (ref 3.5–5.1)
Sodium: 143 mmol/L (ref 135–145)
Total Bilirubin: 1.9 mg/dL — ABNORMAL HIGH (ref 0.3–1.2)
Total Protein: 7.7 g/dL (ref 6.5–8.1)

## 2020-12-01 LAB — RESP PANEL BY RT-PCR (FLU A&B, COVID) ARPGX2
Influenza A by PCR: NEGATIVE
Influenza B by PCR: NEGATIVE
SARS Coronavirus 2 by RT PCR: NEGATIVE

## 2020-12-01 LAB — ACETAMINOPHEN LEVEL: Acetaminophen (Tylenol), Serum: <10 ug/mL — ABNORMAL LOW (ref 10–30)

## 2020-12-01 LAB — RAPID URINE DRUG SCREEN, HOSP PERFORMED
Amphetamines: NOT DETECTED
Barbiturates: NOT DETECTED
Benzodiazepines: POSITIVE — AB
Cocaine: NOT DETECTED
Opiates: NOT DETECTED
Tetrahydrocannabinol: NOT DETECTED

## 2020-12-01 LAB — SALICYLATE LEVEL: Salicylate Lvl: <7 mg/dL — ABNORMAL LOW (ref 7.0–30.0)

## 2020-12-01 MED ORDER — TRAZODONE HCL 50 MG PO TABS: 50.0000 mg | ORAL_TABLET | Freq: Every evening | ORAL | Status: DC | PRN

## 2020-12-01 MED ORDER — ALUM & MAG HYDROXIDE-SIMETH 200-200-20 MG/5ML PO SUSP
30.0000 mL | ORAL | Status: DC | PRN
  Administered 2020-12-02: 30 mL via ORAL
  Filled 2020-12-01: qty 30

## 2020-12-01 MED ORDER — HYDROXYZINE HCL 25 MG PO TABS: 25.0000 mg | ORAL_TABLET | Freq: Three times a day (TID) | ORAL | Status: DC | PRN

## 2020-12-01 MED ORDER — ACETAMINOPHEN 325 MG PO TABS: 650.0000 mg | ORAL_TABLET | Freq: Four times a day (QID) | ORAL | Status: DC | PRN

## 2020-12-01 MED ORDER — MAGNESIUM HYDROXIDE 400 MG/5ML PO SUSP: 30.0000 mL | Freq: Every day | ORAL | Status: DC | PRN

## 2020-12-01 NOTE — ED Notes (Signed)
Report given to [facility] RN

## 2020-12-01 NOTE — ED Provider Notes (Addendum)
Behavioral Health Admission H&P [facility] & OBS)  Date: 12/02/20 Patient Name: Nathaniel Lewis MRN: [id number] Chief Complaint:  Chief Complaint  Patient presents with  . Suicidal      Diagnoses:  Final diagnoses:  Severe episode of recurrent major depressive disorder, without psychotic features (HCC)  Suicidal ideation    HPI: Nathaniel Lewis is a 55 year old male.  Patient presented voluntarily to [facility] as a transfer from [facility] for continuous assessment with reevaluation by psychiatry in the morning. Patient was assessed face-to-face upon arrival to [facility]  Patient is alert and oriented x4, patient is calm and cooperative, speech is slightly slurred low-volume normal pace/tone.  Patient maintains good eye contact during assessment.  Patient's mood is depressed and affect is congruent with mood, his thought process is congruent patient does not appear to be responding to internal/external stimuli.  Patient is not expressing any delusional thought content. Patient denies acute distress, chest pain, SOB, nausea vomiting, headache, dizziness, GI/GU issues.  Patient continued to express suicidal ideation, patient reported that he intentionally ingested approximately 6-8 tablets of Xanax 0.5mg  earlier today after becoming upset for being kicked out of his boarding house.  Patient reported that he did this because he was sad and depressed he reports that if he was kicked out of his boarding house he would have no way to leave. Patient reported that he is unable to contract for safety currently.  Patient denies HI, AVH, paranoia, and patient does not appear to be delusional. Patient reports that he employed, he denies illicit drug use, he report that his last alcoholic drink was ~2-3 months ago.    PHQ 2-9:   Flowsheet Row ED from 12/01/2020 in [facility] Most recent reading at 12/01/2020 11:33 PM ED from 12/01/2020 in [facility] Most recent reading at 12/01/2020 10:17 AM ED from 09/11/2020 in [facility] Most recent reading at 09/10/2020  9:55 PM  C-SSRS RISK CATEGORY Error: Q3, 4, or 5 should not be populated when Q2 is No Low Risk No Risk       Total Time spent with patient: 20 minutes  Musculoskeletal  Strength & Muscle Tone: within normal limits Gait & Station: normal Patient leans: Right  Psychiatric Specialty Exam  Presentation General Appearance: Fairly Groomed  Eye Contact:Good  Speech:Clear and Coherent  Speech Volume:Normal  Handedness:Right   Mood and Affect  Mood:Depressed  Affect:Congruent   Thought Process  Thought Processes:Coherent  Descriptions of Associations:Intact  Orientation:Full (Time, Place and Person)  Thought Content:WDL  Diagnosis of Schizophrenia or Schizoaffective disorder in past: No   Hallucinations:Hallucinations: None  Ideas of Reference:None  Suicidal Thoughts:Suicidal Thoughts: Yes, Passive SI Passive Intent and/or Plan: Without Plan  Homicidal Thoughts:Homicidal Thoughts: No   Sensorium  Memory:Immediate Good; Recent Good; Remote Good  Judgment:Fair  Insight:Good   Executive Functions  Concentration:Good  Attention Span:Good  Recall:Good  Fund of Knowledge:Good  Language:Good   Psychomotor Activity  Psychomotor Activity:Psychomotor Activity: Normal   Assets  Assets:Communication Skills; Desire for Improvement; Physical Health   Sleep  Sleep:Sleep: Fair Number of Hours of Sleep: 6   Nutritional Assessment (For OBS and [facility]BC admissions only) Has the patient had a weight loss or gain of 10 pounds or more in the last 3 months?: No Has the patient had a decrease in food intake/or appetite?: No Does the patient have dental problems?: Yes Does the patient have eating habits or behaviors that may be indicators of an  eating disorder including binging or inducing vomiting?: No Has the patient  recently lost weight without trying?: No Has the patient been eating poorly because of a decreased appetite?: No Malnutrition Screening Tool Score: 0    Physical Exam Vitals and nursing note reviewed.  Constitutional:      Appearance: He is well-developed.  HENT:     Head: Normocephalic and atraumatic.  Eyes:     Conjunctiva/sclera: Conjunctivae normal.  Cardiovascular:     Rate and Rhythm: Normal rate and regular rhythm.     Heart sounds: No murmur heard.   Pulmonary:     Effort: Pulmonary effort is normal. No respiratory distress.     Breath sounds: Normal breath sounds.  Abdominal:     Palpations: Abdomen is soft.     Tenderness: There is no abdominal tenderness.  Musculoskeletal:     Cervical back: Normal range of motion and neck supple. No rigidity.  Skin:    General: Skin is warm and dry.  Neurological:     Mental Status: He is alert and oriented to person, place, and time.  Psychiatric:        Attention and Perception: He is attentive. He does not perceive auditory or visual hallucinations.        Mood and Affect: Mood is depressed.        Speech: Speech normal.        Behavior: Behavior normal. Behavior is cooperative.        Thought Content: Thought content is not paranoid or delusional. Thought content includes suicidal ideation. Thought content does not include homicidal ideation. Thought content does not include homicidal or suicidal plan.        Cognition and Memory: Cognition normal.        Judgment: Judgment normal.    Review of Systems  Constitutional: Negative.  Negative for chills and fever.  HENT: Negative.  Negative for ear discharge and ear pain.   Eyes: Negative for pain, discharge and redness.  Cardiovascular: Negative for chest pain and palpitations.  Genitourinary: Negative.   Musculoskeletal: Negative.   Skin: Negative.   Neurological: Negative for dizziness, tremors, loss of consciousness, weakness and headaches.  Psychiatric/Behavioral:  Positive for depression and suicidal ideas. Negative for hallucinations. The patient is not nervous/anxious.     Blood pressure 114/76, pulse 86, temperature 98.3 F (36.8 C), temperature source Tympanic, resp. rate 18, SpO2 94 %. There is no height or weight on file to calculate BMI.  Past Psychiatric History: ETOH, Depression, GAD, Cocaine abuse, Benzo abuse  Is the patient at risk to self? Yes  Has the patient been a risk to self in the past 6 months? No .    Has the patient been a risk to self within the distant past? Yes   Is the patient a risk to others? No   Has the patient been a risk to others in the past 6 months? No   Has the patient been a risk to others within the distant past? No   Past Medical History:  Past Medical History:  Diagnosis Date  . Anxiety   . ETOH abuse    No past surgical history on file.  Family History: No family history on file.  Social History:  Social History   Socioeconomic History  . Marital status: Single    Spouse name: Not on file  . Number of children: Not on file  . Years of education: Not on file  . Highest education level: Not on  file  Occupational History  . Not on file  Tobacco Use  . Smoking status: Current Every Day Smoker  . Smokeless tobacco: Never Used  Substance and Sexual Activity  . Alcohol use: Yes  . Drug use: No  . Sexual activity: Yes    Birth control/protection: None  Other Topics Concern  . Not on file  Social History Narrative   ** Merged History Encounter **       Social Determinants of Health   Financial Resource Strain: Not on file  Food Insecurity: Not on file  Transportation Needs: Not on file  Physical Activity: Not on file  Stress: Not on file  Social Connections: Not on file  Intimate Partner Violence: Not on file    SDOH:  SDOH Screenings   Alcohol Screen: Not on file  Depression (ZOX0-9(PHQ2-9): Not on file  Financial Resource Strain: Not on file  Food Insecurity: Not on file  Housing:  Not on file  Physical Activity: Not on file  Social Connections: Not on file  Stress: Not on file  Tobacco Use: High Risk  . Smoking Tobacco Use: Current Every Day Smoker  . Smokeless Tobacco Use: Never Used  Transportation Needs: Not on file    Last Labs:  Admission on 12/01/2020, Discharged on 12/01/2020  Component Date Value Ref Range Status  . WBC 12/01/2020 7.3  4.0 - 10.5 K/uL Final  . RBC 12/01/2020 4.73  4.22 - 5.81 MIL/uL Final  . Hemoglobin 12/01/2020 14.1  13.0 - 17.0 g/dL Final  . HCT 60/45/409804/17/2022 41.0  39.0 - 52.0 % Final  . MCV 12/01/2020 86.7  80.0 - 100.0 fL Final  . MCH 12/01/2020 29.8  26.0 - 34.0 pg Final  . MCHC 12/01/2020 34.4  30.0 - 36.0 g/dL Final  . RDW 11/91/478204/17/2022 12.0  11.5 - 15.5 % Final  . Platelets 12/01/2020 205  150 - 400 K/uL Final  . nRBC 12/01/2020 0.0  0.0 - 0.2 % Final  . Neutrophils Relative % 12/01/2020 68  % Final  . Neutro Abs 12/01/2020 4.9  1.7 - 7.7 K/uL Final  . Lymphocytes Relative 12/01/2020 22  % Final  . Lymphs Abs 12/01/2020 1.6  0.7 - 4.0 K/uL Final  . Monocytes Relative 12/01/2020 6  % Final  . Monocytes Absolute 12/01/2020 0.5  0.1 - 1.0 K/uL Final  . Eosinophils Relative 12/01/2020 3  % Final  . Eosinophils Absolute 12/01/2020 0.2  0.0 - 0.5 K/uL Final  . Basophils Relative 12/01/2020 1  % Final  . Basophils Absolute 12/01/2020 0.1  0.0 - 0.1 K/uL Final  . Immature Granulocytes 12/01/2020 0  % Final  . Abs Immature Granulocytes 12/01/2020 0.02  0.00 - 0.07 K/uL Final   Performed at [facility], [address] [place], [place] [id number]  . Sodium 12/01/2020 143  135 - 145 mmol/L Final  . Potassium 12/01/2020 4.1  3.5 - 5.1 mmol/L Final  . Chloride 12/01/2020 106  98 - 111 mmol/L Final  . CO2 12/01/2020 29  22 - 32 mmol/L Final  . Glucose, Bld 12/01/2020 110* 70 - 99 mg/dL Final   Glucose reference range applies only to samples taken after fasting for at least 8 hours.  . BUN 12/01/2020 16  6 - 20 mg/dL  Final  . Creatinine, Ser 12/01/2020 0.95  0.61 - 1.24 mg/dL Final  . Calcium 30/86/578404/17/2022 9.1  8.9 - 10.3 mg/dL Final  . Total Protein 12/01/2020 7.7  6.5 - 8.1 g/dL Final  .  Albumin 12/01/2020 3.9  3.5 - 5.0 g/dL Final  . AST 95/62/1308 33  15 - 41 U/L Final  . ALT 12/01/2020 18  0 - 44 U/L Final  . Alkaline Phosphatase 12/01/2020 61  38 - 126 U/L Final  . Total Bilirubin 12/01/2020 1.9* 0.3 - 1.2 mg/dL Final  . GFR, Estimated 12/01/2020 >60  >60 mL/min Final   Comment: (NOTE) Calculated using the CKD-EPI Creatinine Equation (2021)   . Anion gap 12/01/2020 8  5 - 15 Final   Performed at [facility], [address], [place], [place] [address]  . Alcohol, Ethyl (B) 12/01/2020 <10  <10 mg/dL Final   Comment: (NOTE) Lowest detectable limit for serum alcohol is 10 mg/dL.  For medical purposes only. Performed at [facility], [address] [place], [place] [address]   . Salicylate Lvl 12/01/2020 <7.0* 7.0 - 30.0 mg/dL Final   Performed at [facility], [address] [place], [place] [address]  . Acetaminophen (Tylenol), Serum 12/01/2020 <10* 10 - 30 ug/mL Final   Comment: (NOTE) Therapeutic concentrations vary significantly. A range of 10-30 ug/mL  may be an effective concentration for many patients. However, some  are best treated at concentrations outside of this range. Acetaminophen concentrations >150 ug/mL at 4 hours after ingestion  and >50 ug/mL at 12 hours after ingestion are often associated with  toxic reactions.  Performed at [facility], [address] [place], [place] [address]   . Opiates 12/01/2020 NONE DETECTED  NONE DETECTED Final  . Cocaine 12/01/2020 NONE DETECTED  NONE DETECTED Final  . Benzodiazepines 12/01/2020 POSITIVE* NONE DETECTED Final  . Amphetamines 12/01/2020 NONE DETECTED  NONE DETECTED Final  . Tetrahydrocannabinol 12/01/2020 NONE DETECTED  NONE DETECTED Final  .  Barbiturates 12/01/2020 NONE DETECTED  NONE DETECTED Final   Comment: (NOTE) DRUG SCREEN FOR MEDICAL PURPOSES ONLY.  IF CONFIRMATION IS NEEDED FOR ANY PURPOSE, NOTIFY LAB WITHIN 5 DAYS.  LOWEST DETECTABLE LIMITS FOR URINE DRUG SCREEN Drug Class                     Cutoff (ng/mL) Amphetamine and metabolites    1000 Barbiturate and metabolites    200 Benzodiazepine                 200 Tricyclics and metabolites     300 Opiates and metabolites        300 Cocaine and metabolites        300 THC                            50 Performed at [facility], [address] [place], [place] [address]   . SARS Coronavirus 2 by RT PCR 12/01/2020 NEGATIVE  NEGATIVE Final   Comment: (NOTE) SARS-CoV-2 target nucleic acids are NOT DETECTED.  The SARS-CoV-2 RNA is generally detectable in upper respiratory specimens during the acute phase of infection. The lowest concentration of SARS-CoV-2 viral copies this assay can detect is 138 copies/mL. A negative result does not preclude SARS-Cov-2 infection and should not be used as the sole basis for treatment or other patient management decisions. A negative result may occur with  improper specimen collection/handling, submission of specimen other than nasopharyngeal swab, presence of viral mutation(s) within the areas targeted by this assay, and inadequate number of viral copies(<138 copies/mL). A negative result must be combined with clinical observations, patient  history, and epidemiological information. The expected result is Negative.  Fact Sheet for Patients:  BloggerCourse.com  Fact Sheet for Healthcare Providers:  SeriousBroker.it  This test is no                          t yet approved or cleared by the [place] FDA and  has been authorized for detection and/or diagnosis of SARS-CoV-2 by FDA under an Emergency Use Authorization (EUA). This EUA will remain  in effect  (meaning this test can be used) for the duration of the COVID-19 declaration under Section 564(b)(1) of the Act, 21 U.S.C.section 360bbb-3(b)(1), unless the authorization is terminated  or revoked sooner.      . Influenza A by PCR 12/01/2020 NEGATIVE  NEGATIVE Final  . Influenza B by PCR 12/01/2020 NEGATIVE  NEGATIVE Final   Comment: (NOTE) The Xpert Xpress SARS-CoV-2/FLU/RSV plus assay is intended as an aid in the diagnosis of influenza from Nasopharyngeal swab specimens and should not be used as a sole basis for treatment. Nasal washings and aspirates are unacceptable for Xpert Xpress SARS-CoV-2/FLU/RSV testing.  Fact Sheet for Patients: BloggerCourse.com  Fact Sheet for Healthcare Providers: SeriousBroker.it  This test is not yet approved or cleared by the [place] FDA and has been authorized for detection and/or diagnosis of SARS-CoV-2 by FDA under an Emergency Use Authorization (EUA). This EUA will remain in effect (meaning this test can be used) for the duration of the COVID-19 declaration under Section 564(b)(1) of the Act, 21 U.S.C. section 360bbb-3(b)(1), unless the authorization is terminated or revoked.  Performed at [facility], [address] [place], [place] [address]   Admission on 08/11/2020, Discharged on 08/11/2020  Component Date Value Ref Range Status  . Sodium 08/11/2020 145  135 - 145 mmol/L Final  . Potassium 08/11/2020 4.1  3.5 - 5.1 mmol/L Final  . Chloride 08/11/2020 109  98 - 111 mmol/L Final  . CO2 08/11/2020 24  22 - 32 mmol/L Final  . Glucose, Bld 08/11/2020 97  70 - 99 mg/dL Final   Glucose reference range applies only to samples taken after fasting for at least 8 hours.  . BUN 08/11/2020 18  6 - 20 mg/dL Final  . Creatinine, Ser 08/11/2020 1.02  0.61 - 1.24 mg/dL Final  . Calcium 08/19/7251 8.9  8.9 - 10.3 mg/dL Final  . Total Protein 08/11/2020 7.6  6.5 - 8.1 g/dL  Final  . Albumin 66/44/0347 3.9  3.5 - 5.0 g/dL Final  . AST 42/59/5638 36  15 - 41 U/L Final  . ALT 08/11/2020 27  0 - 44 U/L Final  . Alkaline Phosphatase 08/11/2020 59  38 - 126 U/L Final  . Total Bilirubin 08/11/2020 0.8  0.3 - 1.2 mg/dL Final  . GFR, Estimated 08/11/2020 >60  >60 mL/min Final   Comment: (NOTE) Calculated using the CKD-EPI Creatinine Equation (2021)   . Anion gap 08/11/2020 12  5 - 15 Final   Performed at [facility], [address], [place], [place] [address]  . Alcohol, Ethyl (B) 08/11/2020 <10  <10 mg/dL Final   Comment: (NOTE) Lowest detectable limit for serum alcohol is 10 mg/dL.  For medical purposes only. Performed at [facility], [address], [place], [place] [address]   . Opiates 08/11/2020 NONE DETECTED  NONE DETECTED Final  . Cocaine 08/11/2020 NONE DETECTED  NONE DETECTED Final  . Benzodiazepines 08/11/2020 POSITIVE* NONE DETECTED Final  .  Amphetamines 08/11/2020 NONE DETECTED  NONE DETECTED Final  . Tetrahydrocannabinol 08/11/2020 POSITIVE* NONE DETECTED Final  . Barbiturates 08/11/2020 NONE DETECTED  NONE DETECTED Final   Comment: (NOTE) DRUG SCREEN FOR MEDICAL PURPOSES ONLY.  IF CONFIRMATION IS NEEDED FOR ANY PURPOSE, NOTIFY LAB WITHIN 5 DAYS.  LOWEST DETECTABLE LIMITS FOR URINE DRUG SCREEN Drug Class                     Cutoff (ng/mL) Amphetamine and metabolites    1000 Barbiturate and metabolites    200 Benzodiazepine                 200 Tricyclics and metabolites     300 Opiates and metabolites        300 Cocaine and metabolites        300 THC                            50 Performed at [facility], [address], [place], [place] [address]   . Salicylate Lvl 08/11/2020 <7.0* 7.0 - 30.0 mg/dL Final   Performed at [facility], [address], [place], [place] [address]  . Acetaminophen (Tylenol), Serum 08/11/2020 <10* 10 - 30 ug/mL Final   Comment:  (NOTE) Therapeutic concentrations vary significantly. A range of 10-30 ug/mL  may be an effective concentration for many patients. However, some  are best treated at concentrations outside of this range. Acetaminophen concentrations >150 ug/mL at 4 hours after ingestion  and >50 ug/mL at 12 hours after ingestion are often associated with  toxic reactions.  Performed at [facility], [address] [place], [place] [address]   . Color, Urine 08/11/2020 YELLOW  YELLOW Final  . APPearance 08/11/2020 CLEAR  CLEAR Final  . Specific Gravity, Urine 08/11/2020 1.025  1.005 - 1.030 Final  . pH 08/11/2020 5.0  5.0 - 8.0 Final  . Glucose, UA 08/11/2020 NEGATIVE  NEGATIVE mg/dL Final  . Hgb urine dipstick 08/11/2020 NEGATIVE  NEGATIVE Final  . Bilirubin Urine 08/11/2020 NEGATIVE  NEGATIVE Final  . Ketones, ur 08/11/2020 NEGATIVE  NEGATIVE mg/dL Final  . Protein, ur 28/41/3244 NEGATIVE  NEGATIVE mg/dL Final  . Nitrite 08/19/7251 NEGATIVE  NEGATIVE Final  . Glori Luis 08/11/2020 NEGATIVE  NEGATIVE Final   Performed at [facility], [address] [place], [place] [address]  . WBC 08/11/2020 8.5  4.0 - 10.5 K/uL Final  . RBC 08/11/2020 4.60  4.22 - 5.81 MIL/uL Final  . Hemoglobin 08/11/2020 14.3  13.0 - 17.0 g/dL Final  . HCT 34/74/2595 41.4  39.0 - 52.0 % Final  . MCV 08/11/2020 90.0  80.0 - 100.0 fL Final   Comment: REPEATED TO VERIFY DELTA CHECK NOTED FIRST SAMPLE WAS CLOTTED   . MCH 08/11/2020 31.1  26.0 - 34.0 pg Final  . MCHC 08/11/2020 34.5  30.0 - 36.0 g/dL Final  . RDW 63/87/5643 11.9  11.5 - 15.5 % Final  . Platelets 08/11/2020 259  150 - 400 K/uL Final   Comment: REPEATED TO VERIFY DELTA CHECK NOTED FIRST SAMPLE WAS CLOTTED   . nRBC 08/11/2020 0.0  0.0 - 0.2 % Final   Performed at [facility] [place], [address] [place], [place] [address]  . Neutrophils Relative % 08/11/2020 57  % Final  . Neutro Abs 08/11/2020 4.8   1.7 - 7.7 K/uL Final  . Lymphocytes Relative 08/11/2020 33  %  Final  . Lymphs Abs 08/11/2020 2.8  0.7 - 4.0 K/uL Final  . Monocytes Relative 08/11/2020 6  % Final  . Monocytes Absolute 08/11/2020 0.5  0.1 - 1.0 K/uL Final  . Eosinophils Relative 08/11/2020 3  % Final  . Eosinophils Absolute 08/11/2020 0.2  0.0 - 0.5 K/uL Final  . Basophils Relative 08/11/2020 1  % Final  . Basophils Absolute 08/11/2020 0.1  0.0 - 0.1 K/uL Final  . Immature Granulocytes 08/11/2020 0  % Final  . Abs Immature Granulocytes 08/11/2020 0.02  0.00 - 0.07 K/uL Final   Performed at [facility], [address], [place], [place] [address]    Allergies: Penicillins  PTA Medications: (Not in a hospital admission)   Medical Decision Making  Admit patient to [facility] for continued assessment and stabilization with reevaluation by psychiatry on 12/02/2020.  -Reviewed labs -Continue home medications    Recommendations  Based on my evaluation the patient does not appear to have an emergency medical condition.  Maricela Bo, NP 12/02/20  3:40 AM
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
## 2020-12-01 NOTE — ED Provider Notes (Signed)
Care assumed at shift change from [place], PA-C, pending re-evaluation. See their note for full HPI and workup. Briefly, pt presenting with overdose on Xanax.  Unclear of intentions on initial evaluation.  Plan to reevaluate once more alert for disposition.  BP 119/85   Pulse 85   Temp 97.9 F (36.6 C) (Oral)   Resp 16   SpO2 92%   Clinical Course as of 12/01/20 2008  Sun Dec 01, 2020  1601 Upon assuming care of patient, he is sleeping with normal VS. Will continue to monitor. [JR]  1645 Patient is now awake, sitting in chair, eating crackers out of his bag.  Patient states he took 2 tablets of 1 mg Xanax 3 times over the course of about 4 to 6 hours.  He states he did not take the tablets with intent to end his life though when asked if he has any intent to commit suicide he responds "someday."  Speech is slurred though he is alert and ambulating about the room.  Will consult TTS for evaluation. [JR]  2004 TTS recommends transfer to [facility] for monitoring.  Patient in agreement for inpatient therapy at this time, per TTS. [JR]    Clinical Course User Index [JR] Kisa Fujii, [place] N, PA-C       Cherrell Maybee, [place] N, PA-C 12/01/20 2008    Tegeler, Canary Brim, MD 12/02/20 1414

## 2020-12-01 NOTE — ED Provider Notes (Incomplete)
Behavioral Health Admission H&P [facility](FBC & OBS)  Date: 12/01/20 Patient Name: Nathaniel Lewis MRN: [id number] Chief Complaint:  Chief Complaint  Patient presents with  . Suicidal      Diagnoses:  Final diagnoses:  Severe episode of recurrent major depressive disorder, without psychotic features (HCC)  Suicidal ideation    HPI: Nathaniel Lewis is a 55 year old male.  [facility] for continuous assessment with reevaluation by psychiatry in the morning. Patient was assessed face-to-face upon [facility].  Patient is alert and oriented x4, patient is calm and cooperative, speech is slightly slurred low-volume normal pace/tone.  Patient maintains good eye contact during assessment.  Patient's mood is depressed and affect is congruent with mood, his thought process is congruent patient does not appear to be responding to internal/external stimuli.  Patient is not expressing any delusional thought content. Patient denies acute distress, chest pain, SOB, nausea vomiting, headache, dizziness, GI/GU issues.  Patient continued to express suicidal ideation, patient reported that he intentionally ingested approximately 6-8 tablets of Xanax 0.5mg  earlier today after becoming upset for being kicked out of his boarding house.  Patient reported that he did this because he was sad and depressed he reports that if he was kicked out of his boarding house he would have no way to leave. Patient reported that he is unable to contract for safety currently.  Patient denies HI, AVH, paranoia, and patient does not appear to be delusional.    PHQ 2-9:   Flowsheet Row [facility] Most recent reading at 12/01/2020 11:33 PM [facility] Most recent reading at 12/01/2020 10:17 AM ED [facility]11/2020 [facility] Most recent  reading at 09/10/2020  9:55 PM  C-SSRS RISK CATEGORY Error: Q3, 4, or 5 should not be populated when Q2 is No Low Risk No Risk       Total Time spent with patient: {Time; 15 min - 8 hours:17441}  Musculoskeletal  Strength & Muscle Tone: {desc; muscle tone:32375} Gait & Station: {PE GAIT ED NWGN:56213}ATL:22525} Patient leans: {Patient Leans:21022755}  Psychiatric Specialty Exam  Presentation General Appearance: Fairly Groomed  Eye Contact:Good  Speech:Clear and Coherent  Speech Volume:Normal  Handedness:Right   Mood and Affect  Mood:Depressed  Affect:Congruent   Thought Process  Thought Processes:Coherent  Descriptions of Associations:Intact  Orientation:Full (Time, Place and Person)  Thought Content:WDL  Diagnosis of Schizophrenia or Schizoaffective disorder in past: No   Hallucinations:Hallucinations: None  Ideas of Reference:None  Suicidal Thoughts:Suicidal Thoughts: Yes, Passive SI Passive Intent and/or Plan: Without Plan  Homicidal Thoughts:Homicidal Thoughts: No   Sensorium  Memory:Immediate Good; Recent Good; Remote Good  Judgment:Fair  Insight:Good   Executive Functions  Concentration:Good  Attention Span:Good  Recall:Good  Fund of Knowledge:Good  Language:Good   Psychomotor Activity  Psychomotor Activity:Psychomotor Activity: Normal   Assets  Assets:Communication Skills; Desire for Improvement; Physical Health   Sleep  Sleep:Sleep: Fair Number of Hours of Sleep: 6   Nutritional Assessment (For OBS and FBC admissions only) Has the patient had a weight loss or gain of 10 pounds or more in the last 3 months?: No Has the patient had a decrease in food intake/or appetite?: No Does the patient have dental problems?: Yes Does the patient have eating habits or behaviors that may be indicators of an eating disorder including binging or inducing vomiting?: No Has the patient recently lost  weight without trying?: No Has the patient been  eating poorly because of a decreased appetite?: No Malnutrition Screening Tool Score: 0    Physical Exam ROS  Blood pressure 114/76, pulse 86, temperature 98.3 F (36.8 C), temperature source Tympanic, resp. rate 18, SpO2 94 %. There is no height or weight on file to calculate BMI.  Past Psychiatric History: ***   Is the patient at risk to self? {YES/NO:21197} Has the patient been a risk to self in the past 6 months? {YES/NO:21197}.    Has the patient been a risk to self within the distant past? {YES/NO:21197}  Is the patient a risk to others? {YES/NO:21197}  Has the patient been a risk to others in the past 6 months? {YES/NO:21197}  Has the patient been a risk to others within the distant past? {YES/NO:21197}  Past Medical History:  Past Medical History:  Diagnosis Date  . Anxiety   . ETOH abuse    No past surgical history on file.  Family History: No family history on file.  Social History:  Social History   Socioeconomic History  . Marital status: Single    Spouse name: Not on file  . Number of children: Not on file  . Years of education: Not on file  . Highest education level: Not on file  Occupational History  . Not on file  Tobacco Use  . Smoking status: Current Every Day Smoker  . Smokeless tobacco: Never Used  Substance and Sexual Activity  . Alcohol use: Yes  . Drug use: No  . Sexual activity: Yes    Birth control/protection: None  Other Topics Concern  . Not on file  Social History Narrative   ** Merged History Encounter **       Social Determinants of Health   Financial Resource Strain: Not on file  Food Insecurity: Not on file  Transportation Needs: Not on file  Physical Activity: Not on file  Stress: Not on file  Social Connections: Not on file  Intimate Partner Violence: Not on file    SDOH:  SDOH Screenings   Alcohol Screen: Not on file  Depression (FXT0-2): Not on file  Financial Resource Strain: Not on file  Food Insecurity: Not  on file  Housing: Not on file  Physical Activity: Not on file  Social Connections: Not on file  Stress: Not on file  Tobacco Use: High Risk  . Smoking Tobacco Use: Current Every Day Smoker  . Smokeless Tobacco Use: Never Used  Transportation Needs: Not on file    Last Labs:  Admission on 12/01/2020, Discharged on 12/01/2020  Component Date Value Ref Range Status  . WBC 12/01/2020 7.3  4.0 - 10.5 K/uL Final  . RBC 12/01/2020 4.73  4.22 - 5.81 MIL/uL Final  . Hemoglobin 12/01/2020 14.1  13.0 - 17.0 g/dL Final  . HCT 40/97/3532 41.0  39.0 - 52.0 % Final  . MCV 12/01/2020 86.7  80.0 - 100.0 fL Final  . MCH 12/01/2020 29.8  26.0 - 34.0 pg Final  . MCHC 12/01/2020 34.4  30.0 - 36.0 g/dL Final  . RDW 99/24/2683 12.0  11.5 - 15.5 % Final  . Platelets 12/01/2020 205  150 - 400 K/uL Final  . nRBC 12/01/2020 0.0  0.0 - 0.2 % Final  . Neutrophils Relative % 12/01/2020 68  % Final  . Neutro Abs 12/01/2020 4.9  1.7 - 7.7 K/uL Final  . Lymphocytes Relative 12/01/2020 22  % Final  . Lymphs Abs 12/01/2020 1.6  0.7 - 4.0 K/uL Final  . Monocytes Relative 12/01/2020 6  % Final  . Monocytes Absolute 12/01/2020 0.5  0.1 - 1.0 K/uL Final  . Eosinophils Relative 12/01/2020 3  % Final  . Eosinophils Absolute 12/01/2020 0.2  0.0 - 0.5 K/uL Final  . Basophils Relative 12/01/2020 1  % Final  . Basophils Absolute 12/01/2020 0.1  0.0 - 0.1 K/uL Final  . Immature Granulocytes 12/01/2020 0  % Final  . Abs Immature Granulocytes 12/01/2020 0.02  0.00 - 0.07 K/uL Final   Performed at [facility], [address] [place], [place] [address]  . Sodium 12/01/2020 143  135 - 145 mmol/L Final  . Potassium 12/01/2020 4.1  3.5 - 5.1 mmol/L Final  . Chloride 12/01/2020 106  98 - 111 mmol/L Final  . CO2 12/01/2020 29  22 - 32 mmol/L Final  . Glucose, Bld 12/01/2020 110* 70 - 99 mg/dL Final   Glucose reference range applies only to samples taken after fasting for at least 8 hours.  . BUN 12/01/2020  16  6 - 20 mg/dL Final  . Creatinine, Ser 12/01/2020 0.95  0.61 - 1.24 mg/dL Final  . Calcium 60/45/4098 9.1  8.9 - 10.3 mg/dL Final  . Total Protein 12/01/2020 7.7  6.5 - 8.1 g/dL Final  . Albumin 11/91/4782 3.9  3.5 - 5.0 g/dL Final  . AST 95/62/1308 33  15 - 41 U/L Final  . ALT 12/01/2020 18  0 - 44 U/L Final  . Alkaline Phosphatase 12/01/2020 61  38 - 126 U/L Final  . Total Bilirubin 12/01/2020 1.9* 0.3 - 1.2 mg/dL Final  . GFR, Estimated 12/01/2020 >60  >60 mL/min Final   Comment: (NOTE) Calculated using the CKD-EPI Creatinine Equation (2021)   . Anion gap 12/01/2020 8  5 - 15 Final   Performed at [facility], [address], [place], [place] [address]  . Alcohol, Ethyl (B) 12/01/2020 <10  <10 mg/dL Final   Comment: (NOTE) Lowest detectable limit for serum alcohol is 10 mg/dL.  For medical purposes only. Performed at [facility], [address] [place], [place] [address]   . Salicylate Lvl 12/01/2020 <7.0* 7.0 - 30.0 mg/dL Final   Performed at [facility], [address], [place], [place] [address]  . Acetaminophen (Tylenol), Serum 12/01/2020 <10* 10 - 30 ug/mL Final   Comment: (NOTE) Therapeutic concentrations vary significantly. A range of 10-30 ug/mL  may be an effective concentration for many patients. However, some  are best treated at concentrations outside of this range. Acetaminophen concentrations >150 ug/mL at 4 hours after ingestion  and >50 ug/mL at 12 hours after ingestion are often associated with  toxic reactions.  Performed at [facility], [address] [place], [place] [address]   . Opiates 12/01/2020 NONE DETECTED  NONE DETECTED Final  . Cocaine 12/01/2020 NONE DETECTED  NONE DETECTED Final  . Benzodiazepines 12/01/2020 POSITIVE* NONE DETECTED Final  . Amphetamines 12/01/2020 NONE DETECTED  NONE DETECTED Final  . Tetrahydrocannabinol 12/01/2020 NONE DETECTED  NONE  DETECTED Final  . Barbiturates 12/01/2020 NONE DETECTED  NONE DETECTED Final   Comment: (NOTE) DRUG SCREEN FOR MEDICAL PURPOSES ONLY.  IF CONFIRMATION IS NEEDED FOR ANY PURPOSE, NOTIFY LAB WITHIN 5 DAYS.  LOWEST DETECTABLE LIMITS FOR URINE DRUG SCREEN Drug Class                     Cutoff (ng/mL) Amphetamine and metabolites  1000 Barbiturate and metabolites    200 Benzodiazepine                 200 Tricyclics and metabolites     300 Opiates and metabolites        300 Cocaine and metabolites        300 THC                            50 Performed at [facility], [address] [place], [place] [address]   . SARS Coronavirus 2 by RT PCR 12/01/2020 NEGATIVE  NEGATIVE Final   Comment: (NOTE) SARS-CoV-2 target nucleic acids are NOT DETECTED.  The SARS-CoV-2 RNA is generally detectable in upper respiratory specimens during the acute phase of infection. The lowest concentration of SARS-CoV-2 viral copies this assay can detect is 138 copies/mL. A negative result does not preclude SARS-Cov-2 infection and should not be used as the sole basis for treatment or other patient management decisions. A negative result may occur with  improper specimen collection/handling, submission of specimen other than nasopharyngeal swab, presence of viral mutation(s) within the areas targeted by this assay, and inadequate number of viral copies(<138 copies/mL). A negative result must be combined with clinical observations, patient history, and epidemiological information. The expected result is Negative.  Fact Sheet for Patients:  BloggerCourse.com  Fact Sheet for Healthcare Providers:  SeriousBroker.it  This test is no                          t yet approved or cleared by the [place] FDA and  has been authorized for detection and/or diagnosis of SARS-CoV-2 by FDA under an Emergency Use Authorization (EUA). This EUA will  remain  in effect (meaning this test can be used) for the duration of the COVID-19 declaration under Section 564(b)(1) of the Act, 21 U.S.C.section 360bbb-3(b)(1), unless the authorization is terminated  or revoked sooner.      . Influenza A by PCR 12/01/2020 NEGATIVE  NEGATIVE Final  . Influenza B by PCR 12/01/2020 NEGATIVE  NEGATIVE Final   Comment: (NOTE) The Xpert Xpress SARS-CoV-2/FLU/RSV plus assay is intended as an aid in the diagnosis of influenza from Nasopharyngeal swab specimens and should not be used as a sole basis for treatment. Nasal washings and aspirates are unacceptable for Xpert Xpress SARS-CoV-2/FLU/RSV testing.  Fact Sheet for Patients: BloggerCourse.com  Fact Sheet for Healthcare Providers: SeriousBroker.it  This test is not yet approved or cleared by the [place] FDA and has been authorized for detection and/or diagnosis of SARS-CoV-2 by FDA under an Emergency Use Authorization (EUA). This EUA will remain in effect (meaning this test can be used) for the duration of the COVID-19 declaration under Section 564(b)(1) of the Act, 21 U.S.C. section 360bbb-3(b)(1), unless the authorization is terminated or revoked.  Performed at [facility], [address], [place], [place] [address]   Admission on 08/11/2020, Discharged on 08/11/2020  Component Date Value Ref Range Status  . Sodium 08/11/2020 145  135 - 145 mmol/L Final  . Potassium 08/11/2020 4.1  3.5 - 5.1 mmol/L Final  . Chloride 08/11/2020 109  98 - 111 mmol/L Final  . CO2 08/11/2020 24  22 - 32 mmol/L Final  . Glucose, Bld 08/11/2020 97  70 - 99 mg/dL Final   Glucose reference range applies only to samples taken after fasting for at least 8 hours.  Marland Kitchen  BUN 08/11/2020 18  6 - 20 mg/dL Final  . Creatinine, Ser 08/11/2020 1.02  0.61 - 1.24 mg/dL Final  . Calcium 16/05/9603 8.9  8.9 - 10.3 mg/dL Final  . Total Protein 08/11/2020 7.6   6.5 - 8.1 g/dL Final  . Albumin 54/04/8118 3.9  3.5 - 5.0 g/dL Final  . AST 14/78/2956 36  15 - 41 U/L Final  . ALT 08/11/2020 27  0 - 44 U/L Final  . Alkaline Phosphatase 08/11/2020 59  38 - 126 U/L Final  . Total Bilirubin 08/11/2020 0.8  0.3 - 1.2 mg/dL Final  . GFR, Estimated 08/11/2020 >60  >60 mL/min Final   Comment: (NOTE) Calculated using the CKD-EPI Creatinine Equation (2021)   . Anion gap 08/11/2020 12  5 - 15 Final   Performed at [facility], [address], [place], [place] [address]  . Alcohol, Ethyl (B) 08/11/2020 <10  <10 mg/dL Final   Comment: (NOTE) Lowest detectable limit for serum alcohol is 10 mg/dL.  For medical purposes only. Performed at [facility], [address], [place], [place] [address]   . Opiates 08/11/2020 NONE DETECTED  NONE DETECTED Final  . Cocaine 08/11/2020 NONE DETECTED  NONE DETECTED Final  . Benzodiazepines 08/11/2020 POSITIVE* NONE DETECTED Final  . Amphetamines 08/11/2020 NONE DETECTED  NONE DETECTED Final  . Tetrahydrocannabinol 08/11/2020 POSITIVE* NONE DETECTED Final  . Barbiturates 08/11/2020 NONE DETECTED  NONE DETECTED Final   Comment: (NOTE) DRUG SCREEN FOR MEDICAL PURPOSES ONLY.  IF CONFIRMATION IS NEEDED FOR ANY PURPOSE, NOTIFY LAB WITHIN 5 DAYS.  LOWEST DETECTABLE LIMITS FOR URINE DRUG SCREEN Drug Class                     Cutoff (ng/mL) Amphetamine and metabolites    1000 Barbiturate and metabolites    200 Benzodiazepine                 200 Tricyclics and metabolites     300 Opiates and metabolites        300 Cocaine and metabolites        300 THC                            50 Performed at [facility], [address], [place], [place] [address]   . Salicylate Lvl 08/11/2020 <7.0* 7.0 - 30.0 mg/dL Final   Performed at [facility], [address] [place], [place] [address]  . Acetaminophen (Tylenol), Serum 08/11/2020 <10* 10 - 30 ug/mL  Final   Comment: (NOTE) Therapeutic concentrations vary significantly. A range of 10-30 ug/mL  may be an effective concentration for many patients. However, some  are best treated at concentrations outside of this range. Acetaminophen concentrations >150 ug/mL at 4 hours after ingestion  and >50 ug/mL at 12 hours after ingestion are often associated with  toxic reactions.  Performed at [facility], [address] [place], [place] [address]   . Color, Urine 08/11/2020 YELLOW  YELLOW Final  . APPearance 08/11/2020 CLEAR  CLEAR Final  . Specific Gravity, Urine 08/11/2020 1.025  1.005 - 1.030 Final  . pH 08/11/2020 5.0  5.0 - 8.0 Final  . Glucose, UA 08/11/2020 NEGATIVE  NEGATIVE mg/dL Final  . Hgb urine dipstick 08/11/2020 NEGATIVE  NEGATIVE Final  . Bilirubin Urine 08/11/2020 NEGATIVE  NEGATIVE Final  . Ketones, ur 08/11/2020 NEGATIVE  NEGATIVE mg/dL Final  .  Protein, ur 08/11/2020 NEGATIVE  NEGATIVE mg/dL Final  . Nitrite 56/97/9480 NEGATIVE  NEGATIVE Final  . Glori Luis 08/11/2020 NEGATIVE  NEGATIVE Final   Performed at [facility], [address] [place], [place] [address]  . WBC 08/11/2020 8.5  4.0 - 10.5 K/uL Final  . RBC 08/11/2020 4.60  4.22 - 5.81 MIL/uL Final  . Hemoglobin 08/11/2020 14.3  13.0 - 17.0 g/dL Final  . HCT 74/82/7078 41.4  39.0 - 52.0 % Final  . MCV 08/11/2020 90.0  80.0 - 100.0 fL Final   Comment: REPEATED TO VERIFY DELTA CHECK NOTED FIRST SAMPLE WAS CLOTTED   . MCH 08/11/2020 31.1  26.0 - 34.0 pg Final  . MCHC 08/11/2020 34.5  30.0 - 36.0 g/dL Final  . RDW 67/54/4920 11.9  11.5 - 15.5 % Final  . Platelets 08/11/2020 259  150 - 400 K/uL Final   Comment: REPEATED TO VERIFY DELTA CHECK NOTED FIRST SAMPLE WAS CLOTTED   . nRBC 08/11/2020 0.0  0.0 - 0.2 % Final   Performed at [facility], [address], [place], [place] [address]  . Neutrophils Relative % 08/11/2020 57  % Final  . Neutro Abs  08/11/2020 4.8  1.7 - 7.7 K/uL Final  . Lymphocytes Relative 08/11/2020 33  % Final  . Lymphs Abs 08/11/2020 2.8  0.7 - 4.0 K/uL Final  . Monocytes Relative 08/11/2020 6  % Final  . Monocytes Absolute 08/11/2020 0.5  0.1 - 1.0 K/uL Final  . Eosinophils Relative 08/11/2020 3  % Final  . Eosinophils Absolute 08/11/2020 0.2  0.0 - 0.5 K/uL Final  . Basophils Relative 08/11/2020 1  % Final  . Basophils Absolute 08/11/2020 0.1  0.0 - 0.1 K/uL Final  . Immature Granulocytes 08/11/2020 0  % Final  . Abs Immature Granulocytes 08/11/2020 0.02  0.00 - 0.07 K/uL Final   Performed at [facility], [address], [place], [place] [address]    Allergies: Penicillins  PTA Medications: (Not in a hospital admission)   Medical Decision Making  ***    Recommendations  [facility] MSE Recommendations:[id number]}  Maricela Bo, NP 12/01/20  11:38 PM

## 2020-12-01 NOTE — ED Provider Notes (Signed)
[facility]   CSN: [id number] Arrival date & time: 12/01/20  1005     History Chief Complaint  Patient presents with  . Ingestion    Nathaniel Lewis is a 55 y.o. male presenting for evaluation after a possible OD.   5 caveat due to altered mental status.  Per triage note, patient in the ED after taking 6 Xanax 3 hours prior to arrival.  Per nurse, patient reported he had depression, but this was not a suicidal attempt.  Per nurse, patient states he does not take Xanax every day, but when he does take it he takes a handful at a time.  Pt responding with "leave me alone" to any questions.   Additional history taken chart reviewed.  Patient with a history of anxiety, alcohol abuse, depression.  He has rx for tramadol and Xanax per PMPD database.  HPI     Past Medical History:  Diagnosis Date  . Anxiety   . ETOH abuse     Patient Active Problem List   Diagnosis Date Noted  . Alcohol use disorder, severe, dependence (HCC) 12/19/2018  . SDH (subdural hematoma) (HCC) 09/28/2018  . Alcohol abuse 12/19/2011  . Depression 12/19/2011    No past surgical history on file.     No family history on file.  Social History   Tobacco Use  . Smoking status: Current Every Day Smoker  . Smokeless tobacco: Never Used  Substance Use Topics  . Alcohol use: Yes  . Drug use: No    Home Medications Prior to Admission medications   Medication Sig Start Date End Date Taking? Authorizing Provider  ALPRAZolam Prudy Feeler) 0.5 MG tablet Take 0.5 mg by mouth 5 (five) times daily.    [provider]  carbamazepine (TEGRETOL) 200 MG tablet 800mg  PO QD X 1D, then 600mg  PO QD X 1D, then 400mg  QD X 1D, then 200mg  PO QD X 2D 09/11/20   , PA-C  HYDROcodone-acetaminophen (NORCO/VICODIN) 5-325 MG tablet Take 1 tablet by mouth every 6 (six) hours as needed for severe pain. 01/10/19   Henderly, Britni A, PA-C  VENTOLIN HFA 108 (90  Base) MCG/ACT inhaler Inhale 1-2 puffs into the lungs 4 (four) times daily as needed for wheezing or shortness of breath.  01/03/18   [provider]    Allergies    Penicillins  Review of Systems   Review of Systems  Unable to perform ROS: Mental status change    Physical Exam Updated Vital Signs BP 102/67   Pulse 77   Temp 97.9 F (36.6 C) (Oral)   Resp 18   SpO2 94%   Physical Exam Vitals and nursing note reviewed.  Constitutional:      General: He is not in acute distress.    Appearance: He is well-developed.     Comments: Nontoxic  HENT:     Head: Normocephalic and atraumatic.  Cardiovascular:     Rate and Rhythm: Normal rate and regular rhythm.     Pulses: Normal pulses.  Pulmonary:     Effort: Pulmonary effort is normal.     Breath sounds: Normal breath sounds.  Abdominal:     General: There is no distension.     Palpations: Abdomen is soft.     Tenderness: There is no abdominal tenderness. There is no guarding.  Musculoskeletal:        General: Normal range of motion.     Cervical back: Normal range of motion.  Skin:    General: Skin is warm.     Capillary Refill: Capillary refill takes less than 2 seconds.     Findings: No rash.  Neurological:     Comments: Patient very sleepy, however will give short answers when awakened.  Alert to person.  When asked almost any other question, patient responds with "leave me alone."     ED Results / Procedures / Treatments   Labs (all labs ordered are listed, but only abnormal results are displayed) Labs Reviewed  COMPREHENSIVE METABOLIC PANEL - Abnormal; Notable for the following components:      Result Value   Glucose, Bld 110 (*)    Total Bilirubin 1.9 (*)    All other components within normal limits  SALICYLATE LEVEL - Abnormal; Notable for the following components:   Salicylate Lvl <7.0 (*)    All other components within normal limits  ACETAMINOPHEN LEVEL - Abnormal; Notable for the following  components:   Acetaminophen (Tylenol), Serum <10 (*)    All other components within normal limits  RAPID URINE DRUG SCREEN, HOSP PERFORMED - Abnormal; Notable for the following components:   Benzodiazepines POSITIVE (*)    All other components within normal limits  CBC WITH DIFFERENTIAL/PLATELET  ETHANOL    EKG None  Radiology No results found.  Procedures Procedures   Medications Ordered in ED Medications - No data to display  ED Course  I have reviewed the triage vital signs and the nursing notes.  Pertinent labs & imaging results that were available during my care of the patient were reviewed by me and considered in my medical decision making (see chart for details).    MDM Rules/Calculators/A&P                          Patient presenting after supposedly taking 6 Xanax several hours prior to arrival.  On exam, patient is very sleepy, however he can be awakened enough to verbally respond.  He responds with leave me alone and refuses to answer any other questions.  Will obtain labs to ensure no metabolic cause for his sleepiness.  Will obtain UDS to ensure no other substances are on board.  Labs overall reassuring.  UDS positive only for benzos.  Will allow patient to metabolize and continue to recheck.  When he is more awake, he will need further questioning to ensure this was not a suicidal attempt.  Pt signed out to Gillian Shields, PA-C for recheck when pt is more awake.  Final Clinical Impression(s) / ED Diagnoses Final diagnoses:  None    Rx / DC Orders ED Discharge Orders    None       Alveria Apley, PA-C 12/01/20 1422    Tegeler, Canary Brim, MD 12/01/20 1427

## 2020-12-01 NOTE — ED Notes (Signed)
Pt is [facility] transfer, presents with passive SI, plan to overdose on Xanax.  Pt is currently homeless denies HI or AVH.  Skin search completed,  Monitoring for safety.

## 2020-12-01 NOTE — ED Triage Notes (Signed)
BIBA Per EMS:  [place] after taking 6 xanax x 3 hours ago. PD called EMS due to interacting with pt and finding out amount of xanax taken. Pt states the amount taken is normal for him when he feel he need it.  Hx anxiety & depression Lethargic now  Vitals WDL

## 2020-12-01 NOTE — ED Notes (Signed)
Pt informed that he will need to return to [facility] to pick up his bicycle after discharge from [facility]. Pt verbalized understanding. Pt label placed on bicycle and bicycle left outside office of management per Clent Ridges

## 2020-12-01 NOTE — ED Notes (Signed)
Reached out to [facility] about placement; they will give me a call back when he is accepted as a patient

## 2020-12-01 NOTE — ED Notes (Signed)
Tried to wake up Pt multiple times but goes back to sleep.

## 2020-12-01 NOTE — ED Notes (Signed)
Pt Belongings are behind Nurses station. He brought with him pants, shirt, shoes, a black back pack (in his back pack he had an inhaler and asprin bottle).

## 2020-12-02 ENCOUNTER — Encounter (HOSPITAL_COMMUNITY): Payer: Self-pay | Admitting: Emergency Medicine

## 2020-12-02 ENCOUNTER — Emergency Department (HOSPITAL_COMMUNITY)
Admission: EM | Admit: 2020-12-02 | Discharge: 2020-12-03 | Disposition: A | Discharge status: Home / Self Care | Payer: Self-pay | Attending: Emergency Medicine | Admitting: Emergency Medicine

## 2020-12-02 ENCOUNTER — Other Ambulatory Visit: Payer: Self-pay

## 2020-12-02 DIAGNOSIS — F329 Major depressive disorder, single episode, unspecified: Secondary | ICD-10-CM | POA: Insufficient documentation

## 2020-12-02 DIAGNOSIS — F1020 Alcohol dependence, uncomplicated: Secondary | ICD-10-CM | POA: Diagnosis present

## 2020-12-02 DIAGNOSIS — F1094 Alcohol use, unspecified with alcohol-induced mood disorder: Secondary | ICD-10-CM | POA: Diagnosis present

## 2020-12-02 DIAGNOSIS — F1010 Alcohol abuse, uncomplicated: Secondary | ICD-10-CM | POA: Diagnosis present

## 2020-12-02 DIAGNOSIS — Z20822 Contact with and (suspected) exposure to covid-19: Secondary | ICD-10-CM | POA: Insufficient documentation

## 2020-12-02 DIAGNOSIS — F17200 Nicotine dependence, unspecified, uncomplicated: Secondary | ICD-10-CM | POA: Insufficient documentation

## 2020-12-02 DIAGNOSIS — Z5900 Homelessness unspecified: Secondary | ICD-10-CM

## 2020-12-02 DIAGNOSIS — F32A Depression, unspecified: Secondary | ICD-10-CM

## 2020-12-02 DIAGNOSIS — R45851 Suicidal ideations: Secondary | ICD-10-CM | POA: Insufficient documentation

## 2020-12-02 LAB — COMPREHENSIVE METABOLIC PANEL
ALT: 17 U/L (ref 0–44)
AST: 27 U/L (ref 15–41)
Albumin: 3.9 g/dL (ref 3.5–5.0)
Alkaline Phosphatase: 58 U/L (ref 38–126)
Anion gap: 9 (ref 5–15)
BUN: 13 mg/dL (ref 6–20)
CO2: 28 mmol/L (ref 22–32)
Calcium: 9.5 mg/dL (ref 8.9–10.3)
Chloride: 109 mmol/L (ref 98–111)
Creatinine, Ser: 0.86 mg/dL (ref 0.61–1.24)
GFR, Estimated: >60 mL/min (ref >60)
Glucose, Bld: 111 mg/dL — ABNORMAL HIGH (ref 70–99)
Potassium: 3.4 mmol/L — ABNORMAL LOW (ref 3.5–5.1)
Sodium: 146 mmol/L — ABNORMAL HIGH (ref 135–145)
Total Bilirubin: 0.6 mg/dL (ref 0.3–1.2)
Total Protein: 8 g/dL (ref 6.5–8.1)

## 2020-12-02 LAB — CBC
HCT: 45.9 % (ref 39.0–52.0)
Hemoglobin: 15.4 g/dL (ref 13.0–17.0)
MCH: 29.8 pg (ref 26.0–34.0)
MCHC: 33.6 g/dL (ref 30.0–36.0)
MCV: 88.8 fL (ref 80.0–100.0)
Platelets: 271 10*3/uL (ref 150–400)
RBC: 5.17 MIL/uL (ref 4.22–5.81)
RDW: 12.2 % (ref 11.5–15.5)
WBC: 7.1 10*3/uL (ref 4.0–10.5)
nRBC: 0 % (ref 0.0–0.2)

## 2020-12-02 LAB — RAPID URINE DRUG SCREEN, HOSP PERFORMED
Amphetamines: NOT DETECTED
Barbiturates: NOT DETECTED
Benzodiazepines: POSITIVE — AB
Cocaine: NOT DETECTED
Opiates: NOT DETECTED
Tetrahydrocannabinol: NOT DETECTED

## 2020-12-02 LAB — CARBAMAZEPINE LEVEL, TOTAL: Carbamazepine Lvl: <2 ug/mL — ABNORMAL LOW (ref 4.0–12.0)

## 2020-12-02 LAB — ETHANOL: Alcohol, Ethyl (B): 108 mg/dL — ABNORMAL HIGH (ref <10)

## 2020-12-02 LAB — ACETAMINOPHEN LEVEL: Acetaminophen (Tylenol), Serum: <10 ug/mL — ABNORMAL LOW (ref 10–30)

## 2020-12-02 LAB — SALICYLATE LEVEL: Salicylate Lvl: <7 mg/dL — ABNORMAL LOW (ref 7.0–30.0)

## 2020-12-02 MED ORDER — ONDANSETRON HCL 4 MG PO TABS: 4.0000 mg | ORAL_TABLET | Freq: Three times a day (TID) | ORAL | Status: DC | PRN

## 2020-12-02 MED ORDER — ALBUTEROL SULFATE HFA 108 (90 BASE) MCG/ACT IN AERS: 1.0000 | INHALATION_SPRAY | Freq: Four times a day (QID) | RESPIRATORY_TRACT | Status: DC | PRN

## 2020-12-02 MED ORDER — ALUM & MAG HYDROXIDE-SIMETH 200-200-20 MG/5ML PO SUSP: 30.0000 mL | Freq: Four times a day (QID) | ORAL | Status: DC | PRN

## 2020-12-02 MED ORDER — IBUPROFEN 400 MG PO TABS: 600.0000 mg | ORAL_TABLET | Freq: Three times a day (TID) | ORAL | Status: DC | PRN

## 2020-12-02 NOTE — ED Triage Notes (Signed)
Emergency Medicine Provider Triage Evaluation Note  Nathaniel Lewis , a 55 y.o. male  was evaluated in triage.  Pt complains of suicidal thoughts.  Pt was seen at urgent care pta. He was discharged. Since then he states he has suicidal thoughts and his plan was to "catch a buzz" so he had the courage to hurt himself later. Denies hi or avh. Denies medical complaints. State she has taken 8 mg of xanax and had 5-6 beers since being discharged from [facility].    Review of Systems  Positive: si Negative: Hi, avh  Physical Exam  BP 128/83 (BP Location: Right Arm)   Pulse 96   Temp 98.1 F (36.7 C) (Oral)   Resp 16   SpO2 95%  Gen:   Awake, no distress   HEENT:  Atraumatic  Resp:  Normal effort  Cardiac:  Normal rate  Abd:   Nondistended, nontender  MSK:   Moves extremities without difficulty  Neuro:  Speech clear, pt oriented Psych: SI, no HI or AVH  Medical Decision Making  Medically screening exam initiated at 9:04 PM.  Appropriate orders placed.  Nathaniel Lewis was informed that the remainder of the evaluation will be completed by another provider, this initial triage assessment does not replace that evaluation, and the importance of remaining in the ED until their evaluation is complete.  Clinical Impression   55 y/o m presenting for eval of suicidal thoughts. Was seen at [facility] yesterday, stayed overnight at [facility]. D/c this am at 1030 am. Since then he states he took 8 mg xanax and has drank 6 beers.   7:52 PM Discussed case with poison control. Monitor for sedation and until pt not symptomatic. Get tox labs, etc. They will f/u.   MSE was initiated and I personally evaluated the patient and placed orders (if any) at  9:04 PM on December 02, 2020.  The patient appears stable so that the remainder of the MSE may be completed by another provider.    Karrie Meres, [place] 12/02/20 2104

## 2020-12-02 NOTE — Discharge Instructions (Signed)
Patient is instructed prior to discharge to: Take all medications as prescribed by his/her mental healthcare provider. Report any adverse effects and or reactions from the medicines to his/her outpatient provider promptly. Patient has been instructed & cautioned: To not engage in alcohol and or illegal drug use while on prescription medicines. In the event of worsening symptoms, patient is instructed to call the crisis hotline, 911 and or go to the nearest ED for appropriate evaluation and treatment of symptoms. To follow-up with his/her primary care provider for your other medical issues, concerns and or health care needs.   Please come to [facility] (this facility) during walk in hours for appointment with psychiatrist for further medication management and for therapy.   Walk in hours are 8-11 AM Monday through Thursday for medication management.It is first come, first -serve; it is best to arrive by 7:00 AM. On Friday from 1 pm to 4 pm for therapy intake only. Please arrive by 12:00 pm as it is  first come, first -serve.   When you arrive please go upstairs for your appointment. If you are unsure of where to go, inform the front desk that you are here for a walk in appointment and they will assist you with directions upstairs.  Address:  [address], in [place], [address] Ph: [phone]   

## 2020-12-02 NOTE — ED Triage Notes (Signed)
Pt reports feeling suicidal for "a few days", released from [facility] this morning. When asked if he has done anything to end his life, states "not really". Pt reports taking 8 xanax throughout the day and 4 or 5 beers.

## 2020-12-02 NOTE — ED Provider Notes (Signed)
[facility] EMERGENCY DEPARTMENT Provider Note   CSN: [id number] Arrival date & time: 12/02/20  1714     History Chief Complaint  Patient presents with  . Suicidal    Nathaniel Lewis is a 55 y.o. male.  The history is provided by the patient and medical records.    Nathaniel Lewis is a 55 y.o. male who presents to the Emergency Department complaining of SI. He presents to the ED voluntarily complaining of SI. He reports he is depressed and unhappy with his life. He states he abuses xanax and alcohol and was kicked out of his halfway house yesterday due to using xanax.  He states he was seen at behavioral health last night and was discharged today.  After discharge he returned to using alcohol and xanax.  He states he has no place to go and felt the [facility] did not meet his needs.  He wants a stable shelter/housing and his friend mentioned [facility] in [place].  He denies true suicide plan but wants to get better and get help.      Past Medical History:  Diagnosis Date  . Anxiety   . ETOH abuse     Patient Active Problem List   Diagnosis Date Noted  . Alcohol use disorder, severe, dependence (HCC) 12/19/2018  . SDH (subdural hematoma) (HCC) 09/28/2018  . Alcohol abuse 12/19/2011  . Depression 12/19/2011    History reviewed. No pertinent surgical history.     No family history on file.  Social History   Tobacco Use  . Smoking status: Current Every Day Smoker  . Smokeless tobacco: Never Used  Substance Use Topics  . Alcohol use: Yes  . Drug use: No    Home Medications Prior to Admission medications   Medication Sig Start Date End Date Taking? Authorizing Provider  ALPRAZolam Prudy Feeler) 1 MG tablet Take 1 mg by mouth 4 (four) times daily as needed for anxiety. 11/29/20  Yes [provider]  traMADol (ULTRAM) 50 MG tablet Take 50-100 mg by mouth every 6 (six) hours as needed (for pain). 11/29/20  Yes [provider]  VENTOLIN HFA  108 (90 Base) MCG/ACT inhaler Inhale 2 puffs into the lungs 4 (four) times daily. 01/03/18  Yes [provider]    Allergies    Penicillins  Review of Systems   Review of Systems  All other systems reviewed and are negative.   Physical Exam Updated Vital Signs BP 128/83 (BP Location: Right Arm)   Pulse 96   Temp 98.1 F (36.7 C) (Oral)   Resp 16   SpO2 95%   Physical Exam Vitals and nursing note reviewed.  Constitutional:      Appearance: He is well-developed.  HENT:     Head: Normocephalic and atraumatic.  Cardiovascular:     Rate and Rhythm: Normal rate and regular rhythm.  Pulmonary:     Effort: Pulmonary effort is normal. No respiratory distress.  Musculoskeletal:        General: No tenderness.  Skin:    General: Skin is warm and dry.  Neurological:     Mental Status: He is alert.     Comments: Depressed mood and affect.   Psychiatric:        Behavior: Behavior normal.      ED Results / Procedures / Treatments   Labs (all labs ordered are listed, but only abnormal results are displayed) Labs Reviewed  COMPREHENSIVE METABOLIC PANEL - Abnormal; Notable for the following components:  Result Value   Sodium 146 (*)    Potassium 3.4 (*)    Glucose, Bld 111 (*)    All other components within normal limits  ETHANOL - Abnormal; Notable for the following components:   Alcohol, Ethyl (B) 108 (*)    All other components within normal limits  SALICYLATE LEVEL - Abnormal; Notable for the following components:   Salicylate Lvl <7.0 (*)    All other components within normal limits  ACETAMINOPHEN LEVEL - Abnormal; Notable for the following components:   Acetaminophen (Tylenol), Serum <10 (*)    All other components within normal limits  RAPID URINE DRUG SCREEN, HOSP PERFORMED - Abnormal; Notable for the following components:   Benzodiazepines POSITIVE (*)    All other components within normal limits  RESP PANEL BY RT-PCR (FLU A&B, COVID) ARPGX2  CBC     EKG None  Radiology No results found.  Procedures Procedures   Medications Ordered in ED Medications  ibuprofen (ADVIL) tablet 600 mg (has no administration in time range)  ondansetron (ZOFRAN) tablet 4 mg (has no administration in time range)  alum & mag hydroxide-simeth (MAALOX/MYLANTA) 200-200-20 MG/5ML suspension 30 mL (has no administration in time range)    ED Course  I have reviewed the triage vital signs and the nursing notes.  Pertinent labs & imaging results that were available during my care of the patient were reviewed by me and considered in my medical decision making (see chart for details).    MDM Rules/Calculators/A&P                         Pt here for evaluation of depressive sxs in setting of alcohol and benzo abuse.  He has been medically cleared for psychiatric evaluation and treatment.   Patient care transferred pending psychiatric evaluation. Final Clinical Impression(s) / ED Diagnoses Final diagnoses:  None    Rx / DC Orders ED Discharge Orders    None       Tilden Fossa, MD 12/02/20 2335

## 2020-12-02 NOTE — Progress Notes (Signed)
Pt is presently sleeping.R/R are even and unlabored. No signs of acute distress noted. Staff will monitor for pt's safety.

## 2020-12-02 NOTE — ED Provider Notes (Signed)
FBC/OBS ASAP Discharge Summary  Date and Time: 12/02/2020 10:22 AM  Name: Nathaniel Lewis  MRN:  [id number]   Discharge Diagnoses:  Final diagnoses:  Severe episode of recurrent major depressive disorder, without psychotic features (HCC)  Suicidal ideation    Subjective:   Patient seen and chart reviewed. Pt states that his mood is "kinda crappy" which is related to him losing his housing at his boarding house. He states that when he initially went to the boarding house he was told he would be able to take his medications but states that this was not the case and he was told he could not have his medications. He states that this is stressful for him because he no longer has housing. Discussed patient's ingestion of xanax. Pt adamantly denies that this was a SA and states "that wouldn't [amount he took]  kill me by any means". He indicates that he rook the 6-7 0.5 xanax in order to "chill out" and that he did not have intent or plan to end his life. He reports having passive SI when he awoke this morning. He denies passive or active SI currently, he denies plan or intent. He denies HI/AVH. He expresses that he would like help with housing resources and would like a place that would allow him to take his medications. Pt takes xanax which is prescribed by his outpatient provider. Discussed that housing options may be limited but that I would consult SW for additional resources. D[facility]ed [facility] for additional [facility] resources. Pt amenable.   Stay Summary:  Nathaniel Lewis is a 55 year old male.  [facility] assessment on [facility]/17.Patient [facility].  Patient is alert and oriented x4, patient is calm and cooperative, speech is slightly slurred low-volume normal pace/tone.  Patient maintains good eye contact during assessment.  Patient's mood is depressed and affect is congruent with mood,  his thought process is congruent patient does not appear to be responding to internal/external stimuli.  Patient is not expressing any delusional thought content. Patient denies acute distress, chest pain, SOB, nausea vomiting, headache, dizziness, GI/GU issues. Patient continued to express suicidal ideation, patient reported that he intentionally ingested approximately 6-8 tablets of Xanax 0.5mg  earlier today after becoming upset for being kicked out of his boarding house.  Patient reported that he did this because he was sad and depressed he reports that if he was kicked out of his boarding house he would have no way to leave. Patient reported that he is unable to contract for safety currently.  Patient denies HI, AVH, paranoia, and patient does not appear to be delusional. Patient reports that he employed, he denies illicit drug use, he report that his last alcoholic drink was ~2-3 months ago.   Patient was admitted for overnight observation. Pt denied SI/HI/AVH as above on day of discharge and requesting assistance with housing resources. SW was consulted for assistance. Pt was discharged with outpatient resources.  Total Time spent with patient: 20 minutes  Past Psychiatric History: see H*P Past Medical History:  Past Medical History:  Diagnosis Date  . Anxiety   . ETOH abuse    No past surgical history on file. Family History: No family history on file. Family Psychiatric History: see H&P Social History:  Social History   Substance and Sexual Activity  Alcohol Use Yes     Social History   Substance and Sexual Activity  Drug Use No    Social History   Socioeconomic History  . Marital status: Single    Spouse name: Not on file  . Number of children: Not on file  . Years of education: Not on file  . Highest education level: Not on file  Occupational History  . Not on file  Tobacco Use  . Smoking status: Current Every Day Smoker  . Smokeless tobacco: Never Used  Substance  and Sexual Activity  . Alcohol use: Yes  . Drug use: No  . Sexual activity: Yes    Birth control/protection: None  Other Topics Concern  . Not on file  Social History Narrative   ** Merged History Encounter **       Social Determinants of Health   Financial Resource Strain: Not on file  Food Insecurity: Not on file  Transportation Needs: Not on file  Physical Activity: Not on file  Stress: Not on file  Social Connections: Not on file   SDOH:  SDOH Screenings   Alcohol Screen: Not on file  Depression (POL4-1): Not on file  Financial Resource Strain: Not on file  Food Insecurity: Not on file  Housing: Not on file  Physical Activity: Not on file  Social Connections: Not on file  Stress: Not on file  Tobacco Use: High Risk  . Smoking Tobacco Use: Current Every Day Smoker  . Smokeless Tobacco Use: Never Used  Transportation Needs: Not on file    Has this patient used any form of tobacco in the last 30 days? (Cigarettes, Smokeless Tobacco, Cigars, and/or Pipes) Prescription not provided because: n/a  Current Medications:  Current Facility-Administered Medications  Medication Dose Route Frequency Provider Last Rate Last Admin  . acetaminophen (TYLENOL) tablet 650 mg  650 mg Oral Q6H PRN Ajibola, Ene A, NP      . albuterol (VENTOLIN HFA) 108 (90 Base) MCG/ACT inhaler 1-2 puff  1-2 puff Inhalation QID PRN Ajibola, Ene A, NP      . alum & mag hydroxide-simeth (MAALOX/MYLANTA) 200-200-20 MG/5ML suspension 30 mL  30 mL Oral Q4H PRN Ajibola, Ene A, NP      . hydrOXYzine (ATARAX/VISTARIL) tablet 25 mg  25 mg Oral TID PRN Ajibola, Ene A, NP      . magnesium hydroxide (MILK OF MAGNESIA) suspension 30 mL  30 mL Oral Daily PRN Ajibola, Ene A, NP      . traZODone (DESYREL) tablet 50 mg  50 mg Oral QHS PRN Ajibola, Ene A, NP       Current Outpatient Medications  Medication Sig Dispense Refill  . ALPRAZolam (XANAX) 1 MG tablet Take 1 mg by mouth 4 (four) times daily as needed.    .  traMADol (ULTRAM) 50 MG tablet Take 50-100 mg by mouth every 6 (six) hours.    . VENTOLIN HFA 108 (90 Base) MCG/ACT inhaler Inhale 1-2 puffs into the lungs 4 (four) times daily as needed for wheezing or shortness of breath.   2    PTA Medications: (Not in a hospital admission)   Musculoskeletal  Strength & Muscle Tone: within normal limits Gait & Station: normal Patient leans: N/A  Psychiatric Specialty Exam  Presentation  General Appearance: Fairly Groomed  Eye Contact:Good  Speech:Clear and Coherent  Speech Volume:Normal  Handedness:Right   Mood and Affect  Mood:Depressed  Affect:Congruent   Thought Process  Thought Processes:Coherent  Descriptions of Associations:Intact  Orientation:Full (Time, Place and Person)  Thought Content:WDL  Diagnosis of Schizophrenia or Schizoaffective disorder in past:  No    Hallucinations:Hallucinations: None  Ideas of Reference:None  Suicidal Thoughts:Suicidal Thoughts: Yes, Passive SI Passive Intent and/or Plan: Without Plan  Homicidal Thoughts:Homicidal Thoughts: No   Sensorium  Memory:Immediate Good; Recent Good; Remote Good  Judgment:Fair  Insight:Good   Executive Functions  Concentration:Good  Attention Span:Good  Recall:Good  Fund of Knowledge:Good  Language:Good   Psychomotor Activity  Psychomotor Activity:Psychomotor Activity: Normal   Assets  Assets:Communication Skills; Desire for Improvement; Physical Health   Sleep  Sleep:Sleep: Fair Number of Hours of Sleep: 6   Nutritional Assessment (For OBS and FBC admissions only) Has the patient had a weight loss or gain of 10 pounds or more in the last 3 months?: No Has the patient had a decrease in food intake/or appetite?: No Does the patient have dental problems?: Yes Does the patient have eating habits or behaviors that may be indicators of an eating disorder including binging or inducing vomiting?: No Has the patient recently lost weight  without trying?: No Has the patient been eating poorly because of a decreased appetite?: No Malnutrition Screening Tool Score: 0    Physical Exam  Physical Exam Constitutional:      Appearance: Normal appearance. He is normal weight.  HENT:     Head: Normocephalic and atraumatic.  Eyes:     Extraocular Movements: Extraocular movements intact.  Pulmonary:     Effort: Pulmonary effort is normal.  Neurological:     Mental Status: He is alert.    Review of Systems  Constitutional: Negative for chills and fever.  Eyes: Negative for discharge and redness.  Respiratory: Negative for cough.   Gastrointestinal: Negative for abdominal pain.  Musculoskeletal: Negative for myalgias.  Neurological: Negative for headaches.  Psychiatric/Behavioral: Positive for depression. Negative for suicidal ideas.   Blood pressure (!) 118/92, pulse 87, temperature 98.8 F (37.1 C), temperature source Oral, resp. rate 16, SpO2 98 %. There is no height or weight on file to calculate BMI.  Demographic Factors:  Male, Caucasian and Low socioeconomic status  Loss Factors: Financial problems/change in socioeconomic status  Historical Factors: Impulsivity  Risk Reduction Factors:   Employed and Positive coping skills or problem solving skills  Continued Clinical Symptoms:  Alcohol/Substance Abuse/Dependencies  Cognitive Features That Contribute To Risk:  None    Suicide Risk:  Mild:  Suicidal ideation of limited frequency, intensity, duration, and specificity.  There are no identifiable plans, no associated intent, mild dysphoria and related symptoms, good self-control (both objective and subjective assessment), few other risk factors, and identifiable protective factors, including available and accessible social support.  Plan Of Care/Follow-up recommendations:  Activity:  as tolerated Diet:  regular Other:     Patient is instructed prior to discharge to: Take all medications as prescribed by  his/her mental healthcare provider. Report any adverse effects and or reactions from the medicines to his/her outpatient provider promptly. Patient has been instructed & cautioned: To not engage in alcohol and or illegal drug use while on prescription medicines. In the event of worsening symptoms, patient is instructed to call the crisis hotline, 911 and or go to the nearest ED for appropriate evaluation and treatment of symptoms. To follow-up with his/her primary care provider for your other medical issues, concerns and or health care needs.   Please come to [facility] (this facility) during walk in hours for appointment with psychiatrist for further medication management and for therapy.   Walk in hours are 8-11 AM Monday through Thursday for medication management.It  is first come, first -serve; it is best to arrive by 7:00 AM. On Friday from 1 pm to 4 pm for therapy intake only. Please arrive by 12:00 pm as it is  first come, first -serve.   When you arrive please go upstairs for your appointment. If you are unsure of where to go, inform the front desk that you are here for a walk in appointment and they will assist you with directions upstairs.  Address:  [address] in [place], [address] Ph: [phone]    Disposition: to [facility] via safe transport for housing assistance  Estella Husk, MD 12/02/2020, 10:22 AM

## 2020-12-02 NOTE — ED Notes (Signed)
Pt discharged in no acute distress. Verbalized understanding of discharge instructions to include resources. Safety maintained.

## 2020-12-02 NOTE — ED Notes (Signed)
Pt sleeping at present, no distress noted, monitoring for safety. 

## 2020-12-03 ENCOUNTER — Encounter (HOSPITAL_COMMUNITY): Payer: Self-pay | Admitting: Registered Nurse

## 2020-12-03 DIAGNOSIS — F1094 Alcohol use, unspecified with alcohol-induced mood disorder: Secondary | ICD-10-CM | POA: Diagnosis present

## 2020-12-03 DIAGNOSIS — Z5900 Homelessness unspecified: Secondary | ICD-10-CM

## 2020-12-03 DIAGNOSIS — F1020 Alcohol dependence, uncomplicated: Secondary | ICD-10-CM

## 2020-12-03 LAB — RESP PANEL BY RT-PCR (FLU A&B, COVID) ARPGX2
Influenza A by PCR: NEGATIVE
Influenza B by PCR: NEGATIVE
SARS Coronavirus 2 by RT PCR: NEGATIVE

## 2020-12-03 NOTE — ED Notes (Addendum)
Patient was found wondering around 2W at 2030. Patient was brought back down to the ED where he  Told this rn that he left because he did not want to go to [place] [facility]. Patient states  he had a book bag as well in his belongings. This RN told patient that there was no book bag with his belongings after looking around to locate it.

## 2020-12-03 NOTE — ED Provider Notes (Signed)
Notified that pt is now cleared for discharge.   Linwood Dibbles, MD 12/03/20 [phone]
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
## 2020-12-03 NOTE — ED Provider Notes (Signed)
Emergency Medicine Observation Re-evaluation Note  Nathaniel Lewis is a 55 y.o. male, seen on rounds today.  Pt initially presented to the ED for complaints of Suicidal Currently, the patient is resting in the hallway awaiting reassessment.  Physical Exam  BP 137/78 (BP Location: Right Arm)   Pulse 73   Temp 98 F (36.7 C) (Oral)   Resp 16   SpO2 97%  Physical Exam General: Resting Cardiac: Warm and well-perfused Lungs: Even and unlabored Psych: Calm at present  ED Course / MDM  EKG:EKG Interpretation  Date/Time:  Monday December 02 2020 22:32:42 EDT Ventricular Rate:  81 PR Interval:  142 QRS Duration: 82 QT Interval:  400 QTC Calculation: 464 R Axis:   80 Text Interpretation: Normal sinus rhythm Normal ECG When compared with ECG of 12/01/2020, No significant change was found Confirmed by Dione Booze ([id number]) on 12/03/2020 4:17:48 AM   I have reviewed the labs performed to date as well as medications administered while in observation.  Recent changes in the last 24 hours include psychiatry recommending overnight observation.  Plan  Current plan is for reassessment by psych in the morning. Patient is not under full IVC at this time.   Milagros Loll, MD 12/03/20 [phone]

## 2020-12-03 NOTE — ED Notes (Signed)
Pt alert, eating breakfast; denies SI at this time

## 2020-12-03 NOTE — ED Notes (Signed)
This RN was gathering patients discharge paperwork when this RN was informed that patient got dressed and walked out the back hallway. This RN attempted to look for patient in the back hallways but patient was no where to be found. Charge notified. Safe transport called and canceled at this time. Patient was ambulatory and alert and oriented x4 prior to leaving.

## 2020-12-03 NOTE — Consult Note (Signed)
Telepsych Consultation   Reason for Consult:  Suicidal ideation Referring Physician:  Rayne Du Location of Patient: [facility] ED Location of Provider: Other: [facility]  Patient Identification: Nathaniel Lewis MRN:  [id number] Principal Diagnosis: <principal problem not specified> Diagnosis:  Active Problems:   Alcohol abuse   Alcohol use disorder, severe, dependence (HCC)   Alcohol-induced mood disorder with depressive symptoms (HCC)   Homelessness   Total Time spent with patient: 30 minutes  Subjective:   Nathaniel Lewis is a 55 y.o. male patient admitted to [facility] ED after presenting with complaints of suicidal ideation.   Patient discharged for  [facility] yesterday 12/02/2020  HPI:  Nathaniel Lewis, 55 y.o., male patient seen via tele health by this provider, consulted with Dr. Earlene Plater; and chart reviewed on 12/03/20.  On evaluation [name] reports he was discharged yesterday from [facility] yesterday morning and referred to [facility].  After going to [facility] had nothing to offer or help him with and he was unable to get into a shelter.  States this is the first time he has been homeless and depression worsened and started having suicidal thoughts.  Patient states he is feeling better and wants assistance finding somewhere to stay.  States he was staying in a half way house but wouldn't allow him to take his Xanax and was kicked out when caught taking.  Worsening depression and suicidal ideation based on homelessness; if had a place to stay states he would feel safe.  During evaluation [name] is sitting up in bed in no acute distress.  He is alert, oriented x 4, calm and cooperative.  His mood is dysphoric with congruent affect.  He does not appear to be responding to internal/external stimuli or delusional thoughts.  Patient denies suicidal/self-harm/homicidal ideation, psychosis, and paranoia.  Patient answered question appropriately.  Social work  contacted [employer] and has open bed; patient can come to night. Social work spoke to [employer].    Past Psychiatric History: See above  Risk to Self:  No Risk to Others:  No Prior Inpatient Therapy:  Yes Prior Outpatient Therapy:  Yes  Past Medical History:  Past Medical History:  Diagnosis Date  . Anxiety   . ETOH abuse    History reviewed. No pertinent surgical history. Family History: History reviewed. No pertinent family history. Family Psychiatric  History: Unaware Social History:  Social History   Substance and Sexual Activity  Alcohol Use Yes     Social History   Substance and Sexual Activity  Drug Use No    Social History   Socioeconomic History  . Marital status: Single    Spouse name: Not on file  . Number of children: Not on file  . Years of education: Not on file  . Highest education level: Not on file  Occupational History  . Not on file  Tobacco Use  . Smoking status: Current Every Day Smoker  . Smokeless tobacco: Never Used  Substance and Sexual Activity  . Alcohol use: Yes  . Drug use: No  . Sexual activity: Yes    Birth control/protection: None  Other Topics Concern  . Not on file  Social History Narrative   ** Merged History Encounter **       Social Determinants of Health   Financial Resource Strain: Not on file  Food Insecurity: Not on file  Transportation Needs: Not on file  Physical Activity: Not on file  Stress: Not  on file  Social Connections: Not on file   Additional Social History:    Allergies:   Allergies  Allergen Reactions  . Penicillins Rash    Has patient had a PCN reaction causing immediate rash, facial/tongue/throat swelling, SOB or lightheadedness with hypotension: Unknown Has patient had a PCN reaction causing severe rash involving mucus membranes or skin necrosis: Unknown Has patient had a PCN reaction that required hospitalization: Unknown Has patient had a PCN reaction occurring within the last 10  years: No If all of the above answers are "NO", then may proceed with Cephalosporin use.     Labs:  Results for orders placed or performed during the hospital encounter of 12/02/20 (from the past 48 hour(s))  Rapid urine drug screen (hospital performed)     Status: Abnormal   Collection Time: 12/02/20  7:26 PM  Result Value Ref Range   Opiates NONE DETECTED NONE DETECTED   Cocaine NONE DETECTED NONE DETECTED   Benzodiazepines POSITIVE (A) NONE DETECTED   Amphetamines NONE DETECTED NONE DETECTED   Tetrahydrocannabinol NONE DETECTED NONE DETECTED   Barbiturates NONE DETECTED NONE DETECTED    Comment: (NOTE) DRUG SCREEN FOR MEDICAL PURPOSES ONLY.  IF CONFIRMATION IS NEEDED FOR ANY PURPOSE, NOTIFY LAB WITHIN 5 DAYS.  LOWEST DETECTABLE LIMITS FOR URINE DRUG SCREEN Drug Class                     Cutoff (ng/mL) Amphetamine and metabolites    1000 Barbiturate and metabolites    200 Benzodiazepine                 200 Tricyclics and metabolites     300 Opiates and metabolites        300 Cocaine and metabolites        300 THC                            50 Performed at [facility] Lab, [address], [place], [place] [address]   Comprehensive metabolic panel     Status: Abnormal   Collection Time: 12/02/20  7:36 PM  Result Value Ref Range   Sodium 146 (H) 135 - 145 mmol/L   Potassium 3.4 (L) 3.5 - 5.1 mmol/L   Chloride 109 98 - 111 mmol/L   CO2 28 22 - 32 mmol/L   Glucose, Bld 111 (H) 70 - 99 mg/dL    Comment: Glucose reference range applies only to samples taken after fasting for at least 8 hours.   BUN 13 6 - 20 mg/dL   Creatinine, Ser 6.72 0.61 - 1.24 mg/dL   Calcium 9.5 8.9 - 09.4 mg/dL   Total Protein 8.0 6.5 - 8.1 g/dL   Albumin 3.9 3.5 - 5.0 g/dL   AST 27 15 - 41 U/L   ALT 17 0 - 44 U/L   Alkaline Phosphatase 58 38 - 126 U/L   Total Bilirubin 0.6 0.3 - 1.2 mg/dL   GFR, Estimated >70 >96 mL/min    Comment: (NOTE) Calculated using the CKD-EPI Creatinine Equation  (2021)    Anion gap 9 5 - 15    Comment: Performed at [facility] Lab, [address], [place], [place] [address]  Ethanol     Status: Abnormal   Collection Time: 12/02/20  7:36 PM  Result Value Ref Range   Alcohol, Ethyl (B) 108 (H) <10 mg/dL    Comment: (NOTE) Lowest detectable limit for  serum alcohol is 10 mg/dL.  For medical purposes only. Performed at [facility] Lab, [address], [place], [place] [address]   Salicylate level     Status: Abnormal   Collection Time: 12/02/20  7:36 PM  Result Value Ref Range   Salicylate Lvl <7.0 (L) 7.0 - 30.0 mg/dL    Comment: Performed at [facility] Lab, [address] [place], [place] [address]  Acetaminophen level     Status: Abnormal   Collection Time: 12/02/20  7:36 PM  Result Value Ref Range   Acetaminophen (Tylenol), Serum <10 (L) 10 - 30 ug/mL    Comment: (NOTE) Therapeutic concentrations vary significantly. A range of 10-30 ug/mL  may be an effective concentration for many patients. However, some  are best treated at concentrations outside of this range. Acetaminophen concentrations >150 ug/mL at 4 hours after ingestion  and >50 ug/mL at 12 hours after ingestion are often associated with  toxic reactions.  Performed at [facility] Lab, [address] [place], [place] [address]   cbc     Status: None   Collection Time: 12/02/20  7:36 PM  Result Value Ref Range   WBC 7.1 4.0 - 10.5 K/uL   RBC 5.17 4.22 - 5.81 MIL/uL   Hemoglobin 15.4 13.0 - 17.0 g/dL   HCT 97.3 53.2 - 99.2 %   MCV 88.8 80.0 - 100.0 fL   MCH 29.8 26.0 - 34.0 pg   MCHC 33.6 30.0 - 36.0 g/dL   RDW 42.6 83.4 - 19.6 %   Platelets 271 150 - 400 K/uL   nRBC 0.0 0.0 - 0.2 %    Comment: Performed at [facility] Lab, [address] [place], [place] [address]  Resp Panel by RT-PCR (Flu A&B, Covid) Nasopharyngeal Swab     Status: None   Collection Time: 12/02/20 10:38 PM   Specimen: Nasopharyngeal Swab; Nasopharyngeal(NP) swabs in vial  transport medium  Result Value Ref Range   SARS Coronavirus 2 by RT PCR NEGATIVE NEGATIVE    Comment: (NOTE) SARS-CoV-2 target nucleic acids are NOT DETECTED.  The SARS-CoV-2 RNA is generally detectable in upper respiratory specimens during the acute phase of infection. The lowest concentration of SARS-CoV-2 viral copies this assay can detect is 138 copies/mL. A negative result does not preclude SARS-Cov-2 infection and should not be used as the sole basis for treatment or other patient management decisions. A negative result may occur with  improper specimen collection/handling, submission of specimen other than nasopharyngeal swab, presence of viral mutation(s) within the areas targeted by this assay, and inadequate number of viral copies(<138 copies/mL). A negative result must be combined with clinical observations, patient history, and epidemiological information. The expected result is Negative.  Fact Sheet for Patients:  BloggerCourse.com  Fact Sheet for Healthcare Providers:  SeriousBroker.it  This test is no t yet approved or cleared by the [place] FDA and  has been authorized for detection and/or diagnosis of SARS-CoV-2 by FDA under an Emergency Use Authorization (EUA). This EUA will remain  in effect (meaning this test can be used) for the duration of the COVID-19 declaration under Section 564(b)(1) of the Act, 21 U.S.C.section 360bbb-3(b)(1), unless the authorization is terminated  or revoked sooner.       Influenza A by PCR NEGATIVE NEGATIVE   Influenza B by PCR NEGATIVE NEGATIVE    Comment: (NOTE) The Xpert Xpress SARS-CoV-2/FLU/RSV plus assay is intended as an aid in the diagnosis of influenza from Nasopharyngeal  swab specimens and should not be used as a sole basis for treatment. Nasal washings and aspirates are unacceptable for Xpert Xpress SARS-CoV-2/FLU/RSV testing.  Fact Sheet for  Patients: BloggerCourse.com  Fact Sheet for Healthcare Providers: SeriousBroker.it  This test is not yet approved or cleared by the [place] FDA and has been authorized for detection and/or diagnosis of SARS-CoV-2 by FDA under an Emergency Use Authorization (EUA). This EUA will remain in effect (meaning this test can be used) for the duration of the COVID-19 declaration under Section 564(b)(1) of the Act, 21 U.S.C. section 360bbb-3(b)(1), unless the authorization is terminated or revoked.  Performed at [facility] Lab, [address] [place], [place] [address]     Medications:  Current Facility-Administered Medications  Medication Dose Route Frequency Provider Last Rate Last Admin  . alum & mag hydroxide-simeth (MAALOX/MYLANTA) 200-200-20 MG/5ML suspension 30 mL  30 mL Oral Q6H PRN Tilden Fossa, MD      . ibuprofen (ADVIL) tablet 600 mg  600 mg Oral Q8H PRN Tilden Fossa, MD      . ondansetron [facility]) tablet 4 mg  4 mg Oral Q8H PRN Tilden Fossa, MD       Current Outpatient Medications  Medication Sig Dispense Refill  . ALPRAZolam (XANAX) 1 MG tablet Take 1 mg by mouth 4 (four) times daily as needed for anxiety.    . traMADol (ULTRAM) 50 MG tablet Take 50-100 mg by mouth every 6 (six) hours as needed (for pain).    . VENTOLIN HFA 108 (90 Base) MCG/ACT inhaler Inhale 2 puffs into the lungs 4 (four) times daily.  2    Musculoskeletal: Strength & Muscle Tone: within normal limits Gait & Station: normal Patient leans: N/A  Psychiatric Specialty Exam: Physical Exam Vitals and nursing note reviewed. Chaperone present: Sitter at bedside.  Constitutional:      General: He is not in acute distress.    Appearance: Normal appearance. He is not ill-appearing.  Cardiovascular:     Rate and Rhythm: Normal rate.  Pulmonary:     Effort: Pulmonary effort is normal.  Musculoskeletal:        General: Normal range of  motion.     Cervical back: Normal range of motion.  Neurological:     Mental Status: He is alert and oriented to person, place, and time.  Psychiatric:        Attention and Perception: Attention and perception normal. He does not perceive auditory or visual hallucinations.        Mood and Affect: Affect normal. Mood is depressed.        Speech: Speech normal.        Behavior: Behavior normal. Behavior is cooperative.        Thought Content: Thought content normal. Thought content is not paranoid or delusional. Thought content does not include homicidal or suicidal (Denies at this time) ideation.        Cognition and Memory: Cognition and memory normal.        Judgment: Judgment normal.     Review of Systems  Constitutional: Negative.   HENT: Negative.   Eyes: Negative.   Respiratory: Negative.   Cardiovascular: Negative.   Gastrointestinal: Negative.   Genitourinary: Negative.   Musculoskeletal: Negative.   Skin: Negative.   Neurological: Negative.   Hematological: Negative.   Psychiatric/Behavioral: Negative for agitation, behavioral problems, hallucinations, self-injury and sleep disturbance. Suicidal ideas: Denies at this time. Nervous/anxious: Stable.        Patient states  this is the first time he has been homeless and [facility] anything; depression worsened and started having suicidal thoughts.  States he is feeling better now but wants somewhere to go     Blood pressure 138/79, pulse 82, temperature 98.2 F (36.8 C), temperature source Oral, resp. rate 16, SpO2 91 %.There is no height or weight on file to calculate BMI.  General Appearance: Casual  Eye Contact:  Good  Speech:  Clear and Coherent  Volume:  Normal  Mood:  Dysphoric  Affect:  Appropriate and Congruent  Thought Process:  Coherent, Goal Directed and Descriptions of Associations: Intact  Orientation:  Full (Time, Place, and Person)  Thought Content:  WDL  Suicidal Thoughts:   No  Homicidal Thoughts:  No  Memory:  Immediate;   Good Recent;   Good  Judgement:  Intact  Insight:  Present  Psychomotor Activity:  Normal  Concentration:  Concentration: Good and Attention Span: Good  Recall:  Good  Fund of Knowledge:  Fair  Language:  Good  Akathisia:  No  Handed:  Right  AIMS (if indicated):     Assets:  Communication Skills Desire for Improvement Physical Health Resilience  ADL's:  Intact  Cognition:  WNL  Sleep:        Treatment Plan Summary: Plan Psychiatrically clear.  [facility]  Disposition: No evidence of imminent risk to self or others at present.   Patient does not meet criteria for psychiatric inpatient admission. Supportive therapy provided about ongoing stressors. Discussed crisis plan, support from social network, calling 911, coming to the Emergency Department, and calling Suicide Hotline.  This service was provided via telemedicine using a 2-way, interactive audio and video technology.  Names of all persons participating in this telemedicine service and their role in this encounter. Name: Assunta FoundShuvon Meiya Wisler Role: NP  Name: Dr. Earlene PlaterKatherine Laubach Role: Psychiatrist  Name: Raphael GibneyFrankie Picchi Role: Patient   Name: Dr. Lockie Molauratolo and Dr. Lynelle DoctorKnapp Role: [facility] EDP sent a secure messing informing: Patient has been seen and psychiatrically cleared. Patient suicidal ideation based on homelessness. [facility] and [facility] has given approval for patient to come and can come now. Nursing will need to arrange [facility]. Let [facility] know that [facility] to [place] [facility]. I have also put information in patients discharge instructions with address.       Zeinab Rodwell, NP 12/03/2020 6:26 PM

## 2020-12-03 NOTE — ED Notes (Signed)
Pt belongings inventoried and placed in locker 2

## 2020-12-03 NOTE — BH Assessment (Signed)
[name], NP, recommends overnight observation. Patient will remain at [facility] due to [facility] at capacity. [name], RN, informed of disposition via secure chat.

## 2020-12-03 NOTE — ED Notes (Signed)
Safe transport called for this patient  

## 2020-12-03 NOTE — Discharge Instructions (Signed)
[facility]  [address] [place], [place] [address] The [employer] provides food, shelter and other programs and services to the homeless men of [place] through our [employer]. By offering safe shelter, three meals a day, clean clothing, Biblical counseling, financial planning, vocational training, GED/education and employment assistance, we've helped mend the shattered lives of many homeless men since opening in 1974. We have 388 beds available with an additional 88 mats for emergency situations. Prospective Client Check-In Information . Must provide a photo I.D. within 30 days of your acceptance into the [facility] program. . Help out with chores around the Mission. . No sex offender of any type (pending, charged, registered and/or any other sex related offenses) will be permitted to check in. . Must be willing to abide by all rules, regulations, and policies established by the [employer]. . The following will be provided - shelter, food, clothing, and biblical counseling. If you or someone you know is in need of assistance at our [facility] shelter in [place], [place], please call [phone] ext. [phone]. The [employer] [facility] [employer] is the [employer] 5-month recovery program designed to provide holistic restoration for addicted men and women. Through addiction counseling and other related programs, participants will be able to return to the workforce and become a contributing member of society once again.

## 2020-12-04 ENCOUNTER — Emergency Department (HOSPITAL_COMMUNITY)
Admission: EM | Admit: 2020-12-04 | Discharge: 2020-12-04 | Disposition: A | Discharge status: Home / Self Care | Payer: Self-pay | Attending: Emergency Medicine | Admitting: Emergency Medicine

## 2020-12-04 ENCOUNTER — Encounter (HOSPITAL_COMMUNITY): Payer: Self-pay

## 2020-12-04 ENCOUNTER — Other Ambulatory Visit: Payer: Self-pay

## 2020-12-04 DIAGNOSIS — F1010 Alcohol abuse, uncomplicated: Secondary | ICD-10-CM

## 2020-12-04 DIAGNOSIS — F10129 Alcohol abuse with intoxication, unspecified: Secondary | ICD-10-CM | POA: Insufficient documentation

## 2020-12-04 DIAGNOSIS — E876 Hypokalemia: Secondary | ICD-10-CM | POA: Insufficient documentation

## 2020-12-04 DIAGNOSIS — F17200 Nicotine dependence, unspecified, uncomplicated: Secondary | ICD-10-CM | POA: Insufficient documentation

## 2020-12-04 LAB — COMPREHENSIVE METABOLIC PANEL
ALT: 17 U/L (ref 0–44)
AST: 22 U/L (ref 15–41)
Albumin: 3.9 g/dL (ref 3.5–5.0)
Alkaline Phosphatase: 56 U/L (ref 38–126)
Anion gap: 11 (ref 5–15)
BUN: 18 mg/dL (ref 6–20)
CO2: 22 mmol/L (ref 22–32)
Calcium: 9.2 mg/dL (ref 8.9–10.3)
Chloride: 110 mmol/L (ref 98–111)
Creatinine, Ser: 0.91 mg/dL (ref 0.61–1.24)
GFR, Estimated: >60 mL/min (ref >60)
Glucose, Bld: 105 mg/dL — ABNORMAL HIGH (ref 70–99)
Potassium: 2.9 mmol/L — ABNORMAL LOW (ref 3.5–5.1)
Sodium: 143 mmol/L (ref 135–145)
Total Bilirubin: 0.6 mg/dL (ref 0.3–1.2)
Total Protein: 7.5 g/dL (ref 6.5–8.1)

## 2020-12-04 LAB — CBC
HCT: 44.5 % (ref 39.0–52.0)
Hemoglobin: 14.9 g/dL (ref 13.0–17.0)
MCH: 29.7 pg (ref 26.0–34.0)
MCHC: 33.5 g/dL (ref 30.0–36.0)
MCV: 88.6 fL (ref 80.0–100.0)
Platelets: 260 10*3/uL (ref 150–400)
RBC: 5.02 MIL/uL (ref 4.22–5.81)
RDW: 12.3 % (ref 11.5–15.5)
WBC: 8.9 10*3/uL (ref 4.0–10.5)
nRBC: 0 % (ref 0.0–0.2)

## 2020-12-04 LAB — RAPID URINE DRUG SCREEN, HOSP PERFORMED
Amphetamines: NOT DETECTED
Barbiturates: NOT DETECTED
Benzodiazepines: POSITIVE — AB
Cocaine: NOT DETECTED
Opiates: NOT DETECTED
Tetrahydrocannabinol: NOT DETECTED

## 2020-12-04 LAB — ETHANOL: Alcohol, Ethyl (B): 136 mg/dL — ABNORMAL HIGH (ref <10)

## 2020-12-04 MED ORDER — POTASSIUM CHLORIDE CRYS ER 20 MEQ PO TBCR: 40.0000 meq | EXTENDED_RELEASE_TABLET | Freq: Two times a day (BID) | ORAL | 0 refills | Status: DC

## 2020-12-04 NOTE — ED Triage Notes (Signed)
Brought in by [facility] - pt is intoxicated - seeking drug and alcohol treatment. Pt wants to go to [facility]. Denies any SI/HI.

## 2020-12-05 NOTE — ED Provider Notes (Signed)
[facility] EMERGENCY DEPARTMENT Provider Note   CSN: [id number] Arrival date & time: 12/04/20  0014     History Chief Complaint  Patient presents with  . Alcohol Intoxication  . Mental Health Problem    Nathaniel Lewis is a 55 y.o. male.  Patient states he wants help with quitting drinking.  He has no other complaints.  He has been seen multiple times recently for similar situation most recently yesterday at which time he ended up leaving because he was not satisfied with the disposition provided.   Alcohol Intoxication  Mental Health Problem      Past Medical History:  Diagnosis Date  . Anxiety   . ETOH abuse     Patient Active Problem List   Diagnosis Date Noted  . Alcohol-induced mood disorder with depressive symptoms (HCC) 12/03/2020  . Homelessness 12/03/2020  . Alcohol use disorder, severe, dependence (HCC) 12/19/2018  . SDH (subdural hematoma) (HCC) 09/28/2018  . Alcohol abuse 12/19/2011  . Depression 12/19/2011    History reviewed. No pertinent surgical history.     History reviewed. No pertinent family history.  Social History   Tobacco Use  . Smoking status: Current Every Day Smoker  . Smokeless tobacco: Never Used  Substance Use Topics  . Alcohol use: Yes  . Drug use: No    Home Medications Prior to Admission medications   Medication Sig Start Date End Date Taking? Authorizing Provider  potassium chloride SA (KLOR-CON) 20 MEQ tablet Take 2 tablets (40 mEq total) by mouth 2 (two) times daily for 10 days. 12/04/20 12/14/20 Yes Donyale Berthold, Barbara Cower, MD  ALPRAZolam Prudy Feeler) 1 MG tablet Take 1 mg by mouth 4 (four) times daily as needed for anxiety. 11/29/20   [provider]  traMADol (ULTRAM) 50 MG tablet Take 50-100 mg by mouth every 6 (six) hours as needed (for pain). 11/29/20   [provider]  VENTOLIN HFA 108 (90 Base) MCG/ACT inhaler Inhale 2 puffs into the lungs 4 (four) times daily. 01/03/18   [provider]    Allergies    Penicillins  Review of Systems   Review of Systems  All other systems reviewed and are negative.   Physical Exam Updated Vital Signs BP 106/69   Pulse 78   Temp 98.6 F (37 C) (Oral)   Resp 16   Ht 5\' 10"  (1.778 m)   Wt 85 kg   SpO2 92%   BMI 26.89 kg/m   Physical Exam Vitals and nursing note reviewed.  Constitutional:      Appearance: He is well-developed.  HENT:     Head: Normocephalic and atraumatic.     Nose: No congestion or rhinorrhea.     Mouth/Throat:     Mouth: Mucous membranes are moist.     Pharynx: Oropharynx is clear.  Eyes:     Pupils: Pupils are equal, round, and reactive to light.  Cardiovascular:     Rate and Rhythm: Normal rate.  Pulmonary:     Effort: Pulmonary effort is normal. No respiratory distress.  Abdominal:     General: Abdomen is flat. There is no distension.  Musculoskeletal:        General: Normal range of motion.     Cervical back: Normal range of motion.  Skin:    General: Skin is warm and dry.     Coloration: Skin is not jaundiced or pale.  Neurological:     General: No focal deficit present.     Mental  Status: He is alert.     ED Results / Procedures / Treatments   Labs (all labs ordered are listed, but only abnormal results are displayed) Labs Reviewed  COMPREHENSIVE METABOLIC PANEL - Abnormal; Notable for the following components:      Result Value   Potassium 2.9 (*)    Glucose, Bld 105 (*)    All other components within normal limits  ETHANOL - Abnormal; Notable for the following components:   Alcohol, Ethyl (B) 136 (*)    All other components within normal limits  RAPID URINE DRUG SCREEN, HOSP PERFORMED - Abnormal; Notable for the following components:   Benzodiazepines POSITIVE (*)    All other components within normal limits  CBC    EKG None  Radiology No results found.  Procedures Procedures   Medications Ordered in ED Medications - No data to display  ED Course   I have reviewed the triage vital signs and the nursing notes.  Pertinent labs & imaging results that were available during my care of the patient were reviewed by me and considered in my medical decision making (see chart for details).    MDM Rules/Calculators/A&P                          No indication for further workup or admission at this time. Outpatient resources provided.   Final Clinical Impression(s) / ED Diagnoses Final diagnoses:  Alcohol abuse  Hypokalemia    Rx / DC Orders ED Discharge Orders         Ordered    potassium chloride SA (KLOR-CON) 20 MEQ tablet  2 times daily        12/04/20 0626           Yarianna Varble, Barbara Cower, MD 12/05/20 [phone]

## 2020-12-09 ENCOUNTER — Other Ambulatory Visit: Payer: Self-pay

## 2020-12-09 ENCOUNTER — Emergency Department (HOSPITAL_COMMUNITY)
Admission: EM | Admit: 2020-12-09 | Discharge: 2020-12-10 | Disposition: A | Discharge status: Home / Self Care | Payer: Self-pay | Attending: Emergency Medicine | Admitting: Emergency Medicine

## 2020-12-09 ENCOUNTER — Encounter (HOSPITAL_COMMUNITY): Payer: Self-pay | Admitting: Emergency Medicine

## 2020-12-09 DIAGNOSIS — Y907 Blood alcohol level of 200-239 mg/100 ml: Secondary | ICD-10-CM | POA: Insufficient documentation

## 2020-12-09 DIAGNOSIS — F17200 Nicotine dependence, unspecified, uncomplicated: Secondary | ICD-10-CM | POA: Insufficient documentation

## 2020-12-09 DIAGNOSIS — Z20822 Contact with and (suspected) exposure to covid-19: Secondary | ICD-10-CM | POA: Insufficient documentation

## 2020-12-09 DIAGNOSIS — F10920 Alcohol use, unspecified with intoxication, uncomplicated: Secondary | ICD-10-CM

## 2020-12-09 DIAGNOSIS — F1320 Sedative, hypnotic or anxiolytic dependence, uncomplicated: Secondary | ICD-10-CM | POA: Insufficient documentation

## 2020-12-09 DIAGNOSIS — F1020 Alcohol dependence, uncomplicated: Secondary | ICD-10-CM | POA: Insufficient documentation

## 2020-12-09 DIAGNOSIS — R45851 Suicidal ideations: Secondary | ICD-10-CM

## 2020-12-09 LAB — CBC
HCT: 40.8 % (ref 39.0–52.0)
Hemoglobin: 14.4 g/dL (ref 13.0–17.0)
MCH: 30.5 pg (ref 26.0–34.0)
MCHC: 35.3 g/dL (ref 30.0–36.0)
MCV: 86.4 fL (ref 80.0–100.0)
Platelets: 257 10*3/uL (ref 150–400)
RBC: 4.72 MIL/uL (ref 4.22–5.81)
RDW: 12.5 % (ref 11.5–15.5)
WBC: 7.8 10*3/uL (ref 4.0–10.5)
nRBC: 0 % (ref 0.0–0.2)

## 2020-12-09 LAB — COMPREHENSIVE METABOLIC PANEL
ALT: 18 U/L (ref 0–44)
AST: 27 U/L (ref 15–41)
Albumin: 4.2 g/dL (ref 3.5–5.0)
Alkaline Phosphatase: 57 U/L (ref 38–126)
Anion gap: 11 (ref 5–15)
BUN: 12 mg/dL (ref 6–20)
CO2: 25 mmol/L (ref 22–32)
Calcium: 8.9 mg/dL (ref 8.9–10.3)
Chloride: 100 mmol/L (ref 98–111)
Creatinine, Ser: 0.93 mg/dL (ref 0.61–1.24)
GFR, Estimated: >60 mL/min (ref >60)
Glucose, Bld: 97 mg/dL (ref 70–99)
Potassium: 3.8 mmol/L (ref 3.5–5.1)
Sodium: 136 mmol/L (ref 135–145)
Total Bilirubin: 1.3 mg/dL — ABNORMAL HIGH (ref 0.3–1.2)
Total Protein: 7.7 g/dL (ref 6.5–8.1)

## 2020-12-09 LAB — RAPID URINE DRUG SCREEN, HOSP PERFORMED
Amphetamines: NOT DETECTED
Barbiturates: NOT DETECTED
Benzodiazepines: POSITIVE — AB
Cocaine: NOT DETECTED
Opiates: NOT DETECTED
Tetrahydrocannabinol: NOT DETECTED

## 2020-12-09 LAB — ETHANOL: Alcohol, Ethyl (B): 237 mg/dL — ABNORMAL HIGH (ref <10)

## 2020-12-09 LAB — ACETAMINOPHEN LEVEL: Acetaminophen (Tylenol), Serum: <10 ug/mL — ABNORMAL LOW (ref 10–30)

## 2020-12-09 LAB — SALICYLATE LEVEL: Salicylate Lvl: <7 mg/dL — ABNORMAL LOW (ref 7.0–30.0)

## 2020-12-09 NOTE — ED Notes (Signed)
Patient is asleep.  
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
## 2020-12-09 NOTE — ED Notes (Signed)
Patient was incontinent of urine. Patient states he took xanax and drank beers after work to shut off the world. Patient is cooperative at times and then aggressive and uncooperative at other times.

## 2020-12-09 NOTE — ED Notes (Signed)
Patient has thrown his shoes and socks on the floor. He is currently wrapped in the sheet son his bed. He continues to say inappropriate sexually comment about hisself.

## 2020-12-09 NOTE — ED Notes (Signed)
Pt refuses to wear monitoring equipment. Continues to threaten staff and police officers.

## 2020-12-09 NOTE — ED Triage Notes (Addendum)
Pt BIB [facility]. Pt endorses ETOH and wants to jump in front of cars. He also states that he "would like a father figure."

## 2020-12-10 LAB — RESP PANEL BY RT-PCR (FLU A&B, COVID) ARPGX2
Influenza A by PCR: NEGATIVE
Influenza B by PCR: NEGATIVE
SARS Coronavirus 2 by RT PCR: NEGATIVE

## 2020-12-10 NOTE — ED Notes (Signed)
Pt refusing vital signs monitoring

## 2020-12-10 NOTE — ED Notes (Signed)
Assisted pt to the bathroom and gave a cup of decaf coffee.

## 2020-12-10 NOTE — ED Notes (Signed)
Pt given water and a sandwich when he returned from restroom. Pt sitting on edge of bed eating/drinking at this time.

## 2020-12-10 NOTE — ED Notes (Signed)
Pt ambulated to the bathroom with assistance 

## 2020-12-10 NOTE — ED Notes (Signed)
Pt insisted on walking to restroom. Gave pt non-slip socks, which he put on by himself. Pt ambulated to restroom w/ one assist. Pt is also requesting something to eat. PA notified.

## 2020-12-10 NOTE — ED Notes (Signed)
Breakfast tray delivered. Pt resting

## 2020-12-10 NOTE — ED Notes (Signed)
Patient refusing to leave and refusing vitals prior to discharge.

## 2020-12-10 NOTE — ED Notes (Addendum)
Continues to refuse vital signs

## 2020-12-10 NOTE — BHH Counselor (Signed)
TTS complete. Per Vernard Gambles, PMHNP this patient does not meet in patient care criteria. Patient provided with S/A and mental health resources. [facility] ED notified in AM meeting.

## 2020-12-10 NOTE — BH Assessment (Addendum)
[facility] Assessment Progress Note   Per Vernard Gambles, NP, this voluntary pt does not require psychiatric hospitalization at this time.  Pt is psychiatrically cleared.  Discharge instructions include referral information for area substance abuse treatment providers.  EDP Arby Barrette, MD and pt's nurses, Rolly Salter and Morrie Sheldon, have been notified.  Doylene Canning, MA Triage Specialist [phone]
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
## 2020-12-10 NOTE — Discharge Instructions (Addendum)
To help you maintain a sober lifestyle, a substance abuse treatment program may be beneficial to you.  Contact one of the following providers at your earliest opportunity to ask about enrolling their program: ° °RESIDENTIAL PROGRAMS: ° °     [facility] °     [address] °     [place], [place] [address] °     [phone] ° °     [facility] °     [address] °     [place], [place] [address] °     [phone] ° °     [facility] °     [address] °     , [place] [address] °     [phone] ° °     [facility] °     [address] °     [place], [place] [address] °     [phone] ° °     [facility] °     [address] °     [place], [place] [address] °     [phone] ° °OUTPATIENT PROGRAMS: ° °     [facility] °     [address] °     , [place] [address] °     [phone] °     Ask about their Substance Abuse Intensive Outpatient Program.  They also offer psychiatry/medication management and therapy.  New patients are being seen in their walk-in clinic.  Walk-in hours are Monday - Thursday from 8:00 am - 11:00 am for psychiatry, and Friday from 1:00 pm - 4:00 pm for therapy.  Walk-in patients are seen on a first come, first served basis, so try to arrive as early as possible for the best chance of being seen the same day. ° °

## 2021-02-03 ENCOUNTER — Other Ambulatory Visit: Payer: Self-pay

## 2021-02-03 ENCOUNTER — Ambulatory Visit: Payer: Self-pay | Admitting: Physician Assistant

## 2021-02-03 VITALS — BP 137/88 | HR 71 | Temp 98.2°F | Resp 18 | Ht 69.0 in | Wt 174.0 lb

## 2021-02-03 DIAGNOSIS — F1910 Other psychoactive substance abuse, uncomplicated: Secondary | ICD-10-CM

## 2021-02-03 DIAGNOSIS — F5104 Psychophysiologic insomnia: Secondary | ICD-10-CM

## 2021-02-03 DIAGNOSIS — F330 Major depressive disorder, recurrent, mild: Secondary | ICD-10-CM

## 2021-02-03 DIAGNOSIS — B182 Chronic viral hepatitis C: Secondary | ICD-10-CM

## 2021-02-03 DIAGNOSIS — F411 Generalized anxiety disorder: Secondary | ICD-10-CM

## 2021-02-03 DIAGNOSIS — F1020 Alcohol dependence, uncomplicated: Secondary | ICD-10-CM

## 2021-02-03 DIAGNOSIS — R569 Unspecified convulsions: Secondary | ICD-10-CM

## 2021-02-03 DIAGNOSIS — J4520 Mild intermittent asthma, uncomplicated: Secondary | ICD-10-CM

## 2021-02-03 DIAGNOSIS — R42 Dizziness and giddiness: Secondary | ICD-10-CM

## 2021-02-03 MED ORDER — SERTRALINE HCL 100 MG PO TABS: 1.0000 | ORAL_TABLET | Freq: Every day | ORAL | 1 refills | Status: AC

## 2021-02-03 MED ORDER — GABAPENTIN 300 MG PO CAPS: 1.0000 | ORAL_CAPSULE | Freq: Three times a day (TID) | ORAL | 1 refills | Status: AC

## 2021-02-03 MED ORDER — VENTOLIN HFA 108 (90 BASE) MCG/ACT IN AERS: 2.0000 | INHALATION_SPRAY | Freq: Four times a day (QID) | RESPIRATORY_TRACT | 1 refills | Status: AC | PRN

## 2021-02-03 MED ORDER — QUETIAPINE FUMARATE 50 MG PO TABS: 50.0000 mg | ORAL_TABLET | Freq: Every day | ORAL | 1 refills | Status: AC

## 2021-02-03 NOTE — Progress Notes (Signed)
Patient reports insomnia since coming to the facility. Patient states trazodone does not provide relief. Patient has been taking Zoloft at night to aide in not sleeping, with no relief. Patient has taken medication today. Patient has eaten today. Patient denies pain at this time.

## 2021-02-03 NOTE — Patient Instructions (Signed)
For your insomnia, you will start Seroquel 50 mg and I encourage you to continue Zoloft as directed.  I encourage you to increase your water intake, you should drink at least 64 ounces of water a day.  Please return to the mobile unit in 2 weeks for fasting labs and follow-up.  Roney Jaffe, PA-C Physician Assistant [facility] [email or web address]   Insomnia Insomnia is a sleep disorder that makes it difficult to fall asleep or stay asleep. Insomnia can cause fatigue, low energy, difficulty concentrating, moodswings, and poor performance at work or school. There are three different ways to classify insomnia: Difficulty falling asleep. Difficulty staying asleep. Waking up too early in the morning. Any type of insomnia can be long-term (chronic) or short-term (acute). Both are common. Short-term insomnia usually lasts for three months or less. Chronic insomnia occurs at least three times a week for longer than threemonths. What are the causes? Insomnia may be caused by another condition, situation, or substance, such as: Anxiety. Certain medicines. Gastroesophageal reflux disease (GERD) or other gastrointestinal conditions. Asthma or other breathing conditions. Restless legs syndrome, sleep apnea, or other sleep disorders. Chronic pain. Menopause. Stroke. Abuse of alcohol, tobacco, or illegal drugs. Mental health conditions, such as depression. Caffeine. Neurological disorders, such as Alzheimer's disease. An overactive thyroid (hyperthyroidism). Sometimes, the cause of insomnia may not be known. What increases the risk? Risk factors for insomnia include: Gender. Women are affected more often than men. Age. Insomnia is more common as you get older. Stress. Lack of exercise. Irregular work schedule or working night shifts. Traveling between different time zones. Certain medical and mental health conditions. What are the  signs or symptoms? If you have insomnia, the main symptom is having trouble falling asleep or having trouble staying asleep. This may lead to other symptoms, such as: Feeling fatigued or having low energy. Feeling nervous about going to sleep. Not feeling rested in the morning. Having trouble concentrating. Feeling irritable, anxious, or depressed. How is this diagnosed? This condition may be diagnosed based on: Your symptoms and medical history. Your health care provider may ask about: Your sleep habits. Any medical conditions you have. Your mental health. A physical exam. How is this treated? Treatment for insomnia depends on the cause. Treatment may focus on treating an underlying condition that is causing insomnia. Treatment may also include: Medicines to help you sleep. Counseling or therapy. Lifestyle adjustments to help you sleep better. Follow these instructions at home: Eating and drinking  Limit or avoid alcohol, caffeinated beverages, and cigarettes, especially close to bedtime. These can disrupt your sleep. Do not eat a large meal or eat spicy foods right before bedtime. This can lead to digestive discomfort that can make it hard for you to sleep.  Sleep habits  Keep a sleep diary to help you and your health care provider figure out what could be causing your insomnia. Write down: When you sleep. When you wake up during the night. How well you sleep. How rested you feel the next day. Any side effects of medicines you are taking. What you eat and drink. Make your bedroom a dark, comfortable place where it is easy to fall asleep. Put up shades or blackout curtains to block light from outside. Use a white noise machine to block noise. Keep the temperature cool. Limit screen use before bedtime. This includes: Watching TV. Using your smartphone, tablet, or computer. Stick to a routine that includes going to bed and waking up at the same  times every day and night. This  can help you fall asleep faster. Consider making a quiet activity, such as reading, part of your nighttime routine. Try to avoid taking naps during the day so that you sleep better at night. Get out of bed if you are still awake after 15 minutes of trying to sleep. Keep the lights down, but try reading or doing a quiet activity. When you feel sleepy, go back to bed.  General instructions Take over-the-counter and prescription medicines only as told by your health care provider. Exercise regularly, as told by your health care provider. Avoid exercise starting several hours before bedtime. Use relaxation techniques to manage stress. Ask your health care provider to suggest some techniques that may work well for you. These may include: Breathing exercises. Routines to release muscle tension. Visualizing peaceful scenes. Make sure that you drive carefully. Avoid driving if you feel very sleepy. Keep all follow-up visits as told by your health care provider. This is important. Contact a health care provider if: You are tired throughout the day. You have trouble in your daily routine due to sleepiness. You continue to have sleep problems, or your sleep problems get worse. Get help right away if: You have serious thoughts about hurting yourself or someone else. If you ever feel like you may hurt yourself or others, or have thoughts about taking your own life, get help right away. You can go to your nearest emergency department or call: Your local emergency services (911 in the [place] A suicide crisis helpline, such as the National Suicide Prevention Lifeline at 3195762980. This is open 24 hours a day. Summary Insomnia is a sleep disorder that makes it difficult to fall asleep or stay asleep. Insomnia can be long-term (chronic) or short-term (acute). Treatment for insomnia depends on the cause. Treatment may focus on treating an underlying condition that is causing insomnia. Keep a sleep diary  to help you and your health care provider figure out what could be causing your insomnia. This information is not intended to replace advice given to you by your health care provider. Make sure you discuss any questions you have with your healthcare provider. Document Revised: 06/13/2020 Document Reviewed: 06/13/2020 Elsevier Patient Education  2022 [employer].
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
## 2021-02-04 DIAGNOSIS — F1910 Other psychoactive substance abuse, uncomplicated: Secondary | ICD-10-CM | POA: Insufficient documentation

## 2021-02-04 DIAGNOSIS — J4520 Mild intermittent asthma, uncomplicated: Secondary | ICD-10-CM | POA: Insufficient documentation

## 2021-02-04 DIAGNOSIS — R42 Dizziness and giddiness: Secondary | ICD-10-CM | POA: Insufficient documentation

## 2021-02-04 DIAGNOSIS — F5104 Psychophysiologic insomnia: Secondary | ICD-10-CM | POA: Insufficient documentation

## 2021-02-18 ENCOUNTER — Ambulatory Visit (HOSPITAL_COMMUNITY): Payer: No Payment, Other | Admitting: Behavioral Health

## 2021-03-01 IMAGING — CT CT HEAD W/O CM
3 series · 14 of 47 positions shown, 16 images · non-contrast
Comparison: 10/12/2018

CLINICAL DATA: Overdose on tramadol.  Altered mental status.

EXAM:
CT HEAD WITHOUT CONTRAST
TECHNIQUE: Contiguous axial images were obtained from the base of the skull
through the vertex without intravenous contrast.

[Series 2: head wo · axial · 0.47mm/px · z∈[+1568,+1713]mm · 8 of 35 slices shown, 10 images]
[im 3/35  brain]
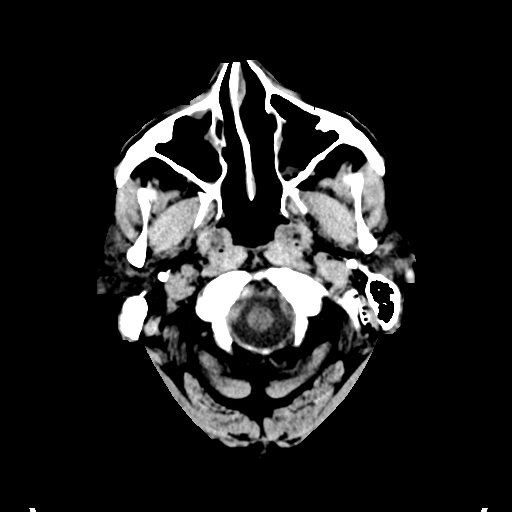
[im 3/35  bone]
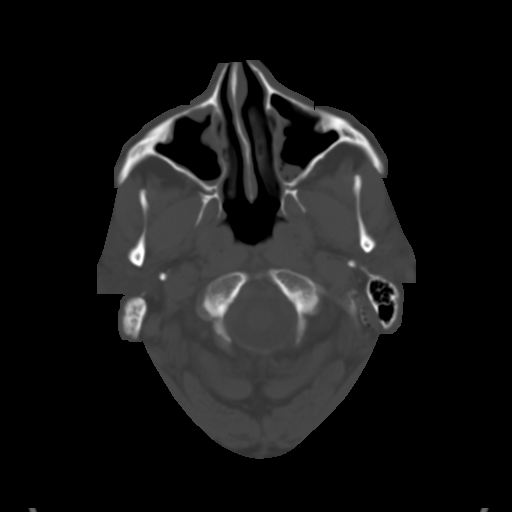
[im 8/35  brain]
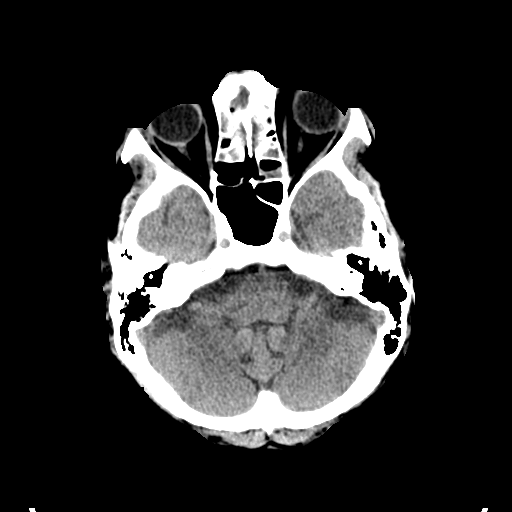
[im 11/35  brain]
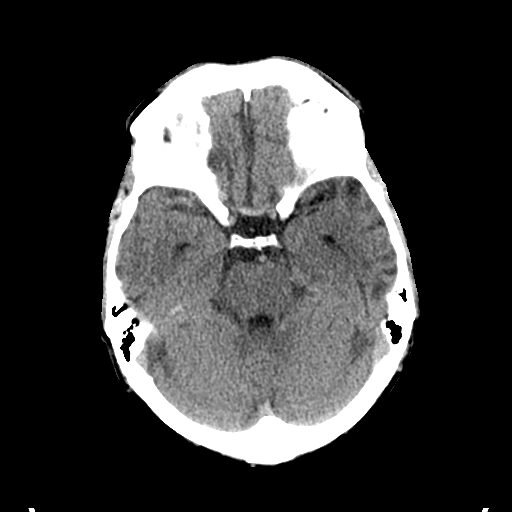
[im 16/35  brain]
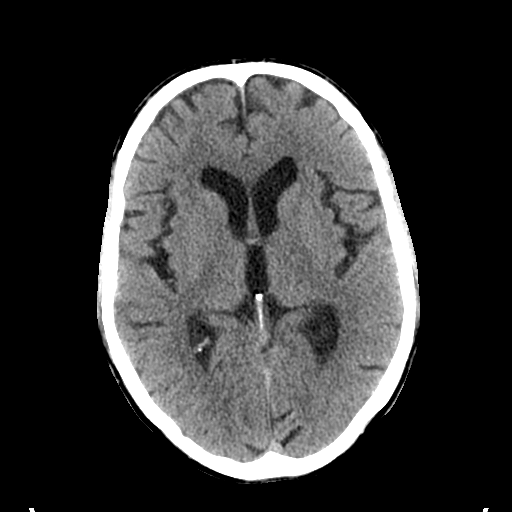
[im 19/35  brain]
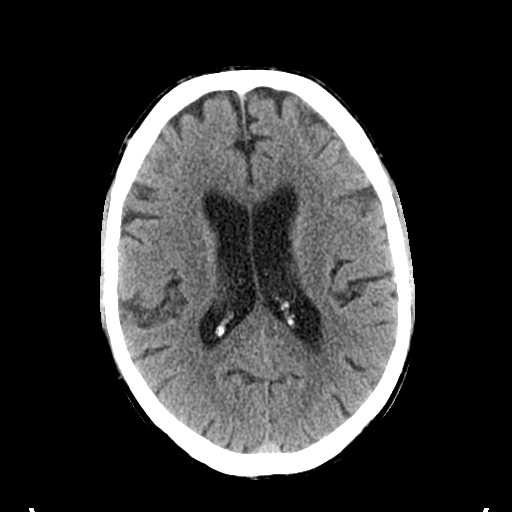
[im 19/35  bone]
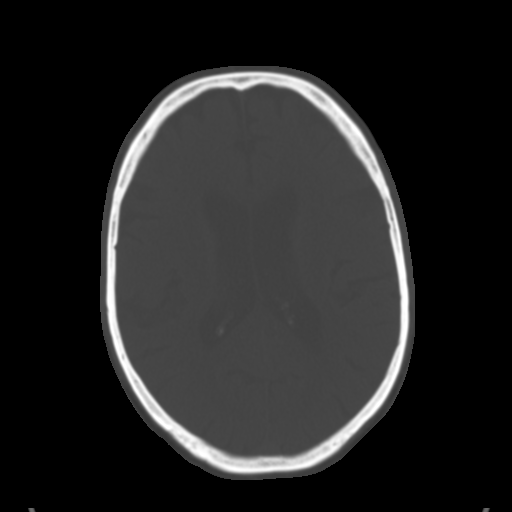
[im 24/35  brain]
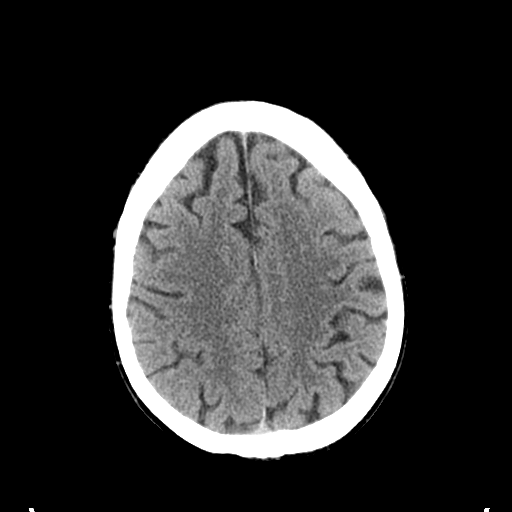
[im 27/35  brain]
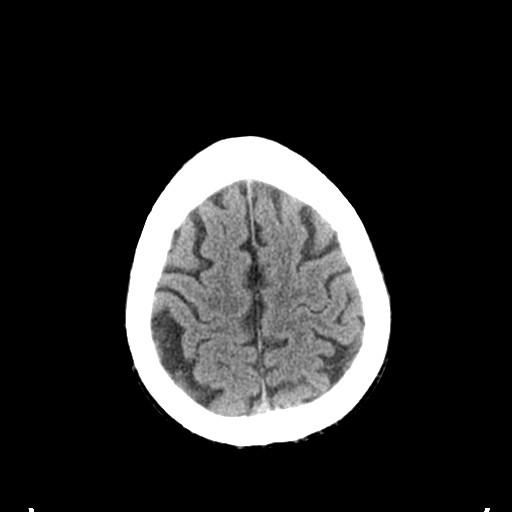
[im 32/35  brain]
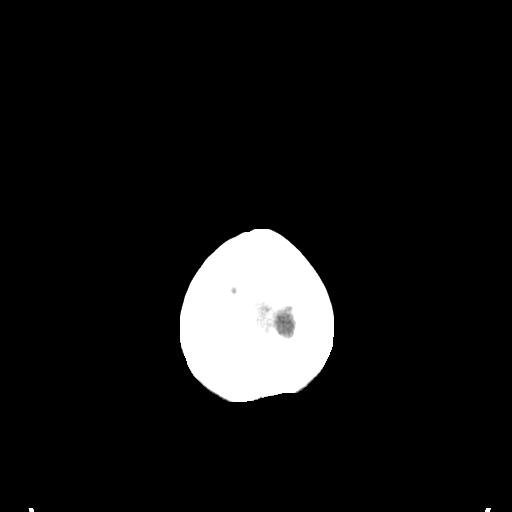

[Series 5: coronal soft tissue · coronal · 0.33mm/px · 3 of 71 slices shown]
[im 24/71  brain]
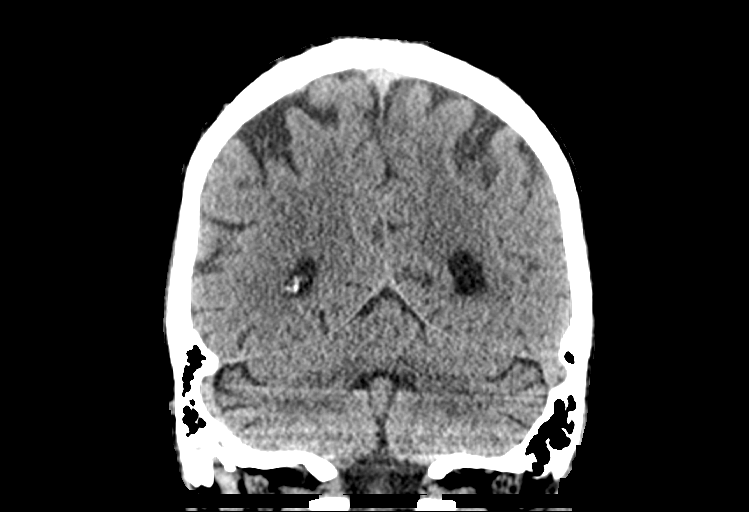
[im 32/71  brain]
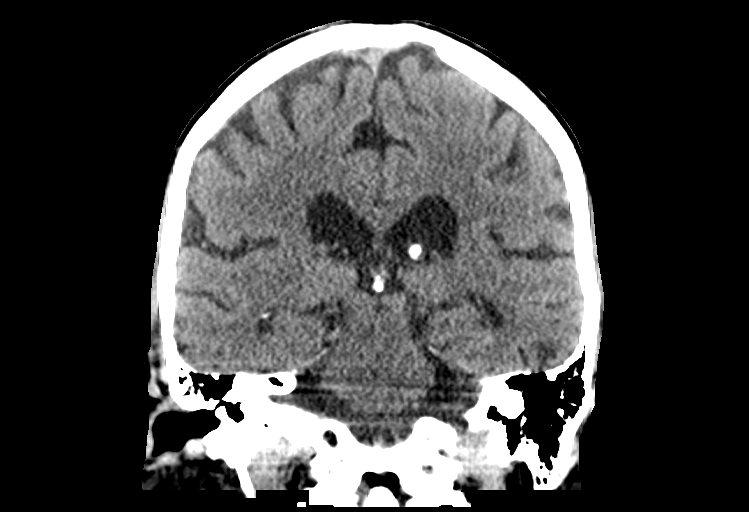
[im 39/71  brain]
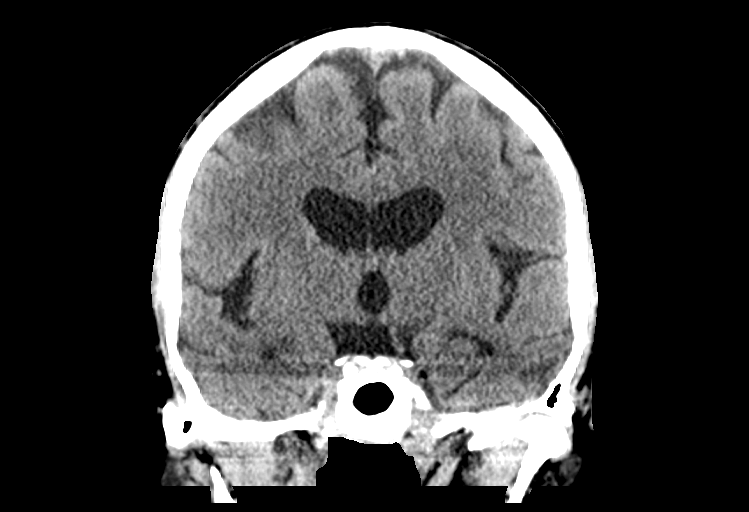

[Series 6: sagittal soft tissue · sagittal · 0.33mm/px · 3 of 60 slices shown]
[im 20/60  brain]
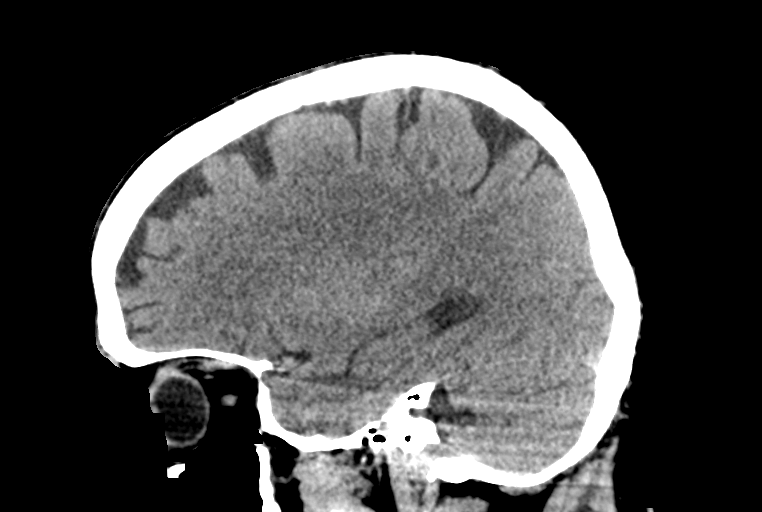
[im 30/60  brain]
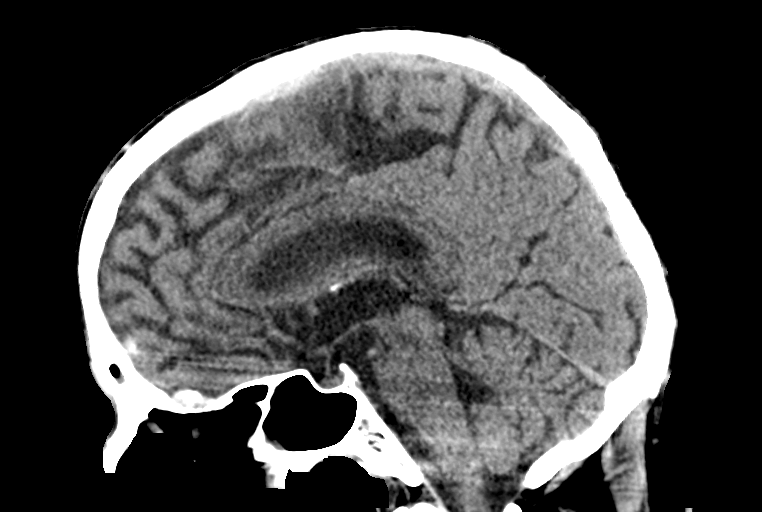
[im 40/60  brain]
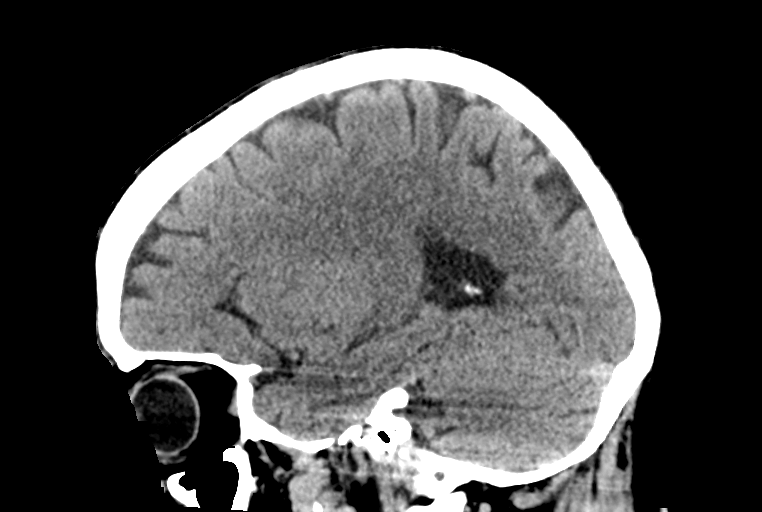

[14 of 47 positions shown; findings below may reference images not displayed]

FINDINGS: Brain: Ventricles, cisterns and other CSF spaces are normal. There
is no mass, mass effect, shift of midline structures or acute
hemorrhage. No evidence of acute infarction.

Vascular: No hyperdense vessel or unexpected calcification.

Skull: Normal. Negative for fracture or focal lesion.

Sinuses/Orbits: Orbits are normal symmetric. Paranasal sinuses are
notable for hypoplastic frontal sinuses. There is opacification over
the frontal and ethmoid air cells as well as mild mucosal membrane
thickening over the maxillary sinuses. Mastoid air cells are clear.

Other: None.
IMPRESSION: 1. No acute findings.
2. Chronic sinus inflammatory change as described.

## 2021-03-31 ENCOUNTER — Other Ambulatory Visit: Payer: Self-pay | Admitting: Physician Assistant

## 2021-03-31 DIAGNOSIS — F5104 Psychophysiologic insomnia: Secondary | ICD-10-CM

## 2021-12-15 DEATH — deceased
